# Patient Record
Sex: Female | Born: 1990 | Race: Black or African American | Hispanic: No | Marital: Single | Smoking: Never smoker
Health system: Southern US, Community
[De-identification: ages and names within clinical notes are randomized; demographics above are authoritative.]

## PROBLEM LIST (undated history)

## (undated) DIAGNOSIS — K219 Gastro-esophageal reflux disease without esophagitis: Secondary | ICD-10-CM

## (undated) DIAGNOSIS — R569 Unspecified convulsions: Principal | ICD-10-CM

## (undated) DIAGNOSIS — T4145XA Adverse effect of unspecified anesthetic, initial encounter: Secondary | ICD-10-CM

## (undated) DIAGNOSIS — R0600 Dyspnea, unspecified: Secondary | ICD-10-CM

## (undated) DIAGNOSIS — D649 Anemia, unspecified: Secondary | ICD-10-CM

## (undated) DIAGNOSIS — Z9289 Personal history of other medical treatment: Secondary | ICD-10-CM

## (undated) DIAGNOSIS — J45909 Unspecified asthma, uncomplicated: Secondary | ICD-10-CM

## (undated) DIAGNOSIS — T8859XA Other complications of anesthesia, initial encounter: Secondary | ICD-10-CM

## (undated) DIAGNOSIS — D219 Benign neoplasm of connective and other soft tissue, unspecified: Secondary | ICD-10-CM

## (undated) HISTORY — PX: MYOMECTOMY: SHX85

## (undated) HISTORY — DX: Anemia, unspecified: D64.9

## (undated) HISTORY — DX: Unspecified asthma, uncomplicated: J45.909

## (undated) HISTORY — DX: Unspecified convulsions: R56.9

---
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
## 2012-08-24 ENCOUNTER — Ambulatory Visit: Payer: Self-pay | Admitting: Physician Assistant

## 2012-08-24 VITALS — BP 118/74 | HR 86 | Temp 98.6°F | Resp 16 | Ht 60.0 in | Wt 135.0 lb

## 2012-08-24 DIAGNOSIS — R350 Frequency of micturition: Secondary | ICD-10-CM

## 2012-08-24 DIAGNOSIS — R569 Unspecified convulsions: Secondary | ICD-10-CM

## 2012-08-24 DIAGNOSIS — N760 Acute vaginitis: Secondary | ICD-10-CM

## 2012-08-24 LAB — POCT URINALYSIS DIPSTICK
Glucose, UA: NEGATIVE
Ketones, UA: NEGATIVE
Spec Grav, UA: 1.02
Urobilinogen, UA: 0.2

## 2012-08-24 LAB — POCT URINE PREGNANCY: Preg Test, Ur: NEGATIVE

## 2012-08-24 LAB — POCT WET PREP WITH KOH
WBC Wet Prep HPF POC: NEGATIVE
Yeast Wet Prep HPF POC: NEGATIVE

## 2012-08-24 LAB — POCT UA - MICROSCOPIC ONLY

## 2012-08-24 NOTE — Progress Notes (Signed)
Subjective:    Patient ID: Audrey Lang, female    DOB: 1991-12-06, 21 y.o.   MRN: [id number]  HPI This 21 y.o. female presents for evaluation of itching and vaginal discharge.  Thought she had a yeast infection, and a cold (had chills and back pain).  Used OTC Monistat yesterday and felt less itchy.   Still has itching and has some back pain, chills resolved.  Increased urinary frequency with low volume, has been drinking lots of water. No GI symptoms.  Monogamous sex with one partner, who lives in [place].  She's just moved here to attend school (taking classes at [employer] to transfer to [employer]).  Only takes topiramate intermittently, due to adverse effects, but has remained seizure free.    Review of Systems As above.   Past Medical History  Diagnosis Date  . Seizures 2009    last seizure 2011    Past Surgical History  Procedure Date  . Tonsillectomy 2003    Prior to Admission medications   Medication Sig Start Date End Date Taking? Authorizing Provider  topiramate (TOPAMAX) 50 MG tablet Take 100 mg by mouth 2 (two) times daily.   Yes Historical Provider, MD    No Known Allergies  History   Social History  . Marital Status: Single    Spouse Name: n/a    Number of Children: 0  . Years of Education: N/A   Occupational History  . Student     [employer]   Social History Main Topics  . Smoking status: Never Smoker   . Smokeless tobacco: Never Used  . Alcohol Use: 0.5 oz/week    1 drink(s) per week  . Drug Use: No  . Sexually Active: Not Currently -- Female partner(s)    Birth Control/ Protection: Condom   Other Topics Concern  . Not on file   Social History Narrative   Boyfriend lives in [place].    Family History  Problem Relation Age of Onset  . Ulcers Mother   . Asthma Sister        Objective:   Physical Exam  Blood pressure 118/74, pulse 86, temperature 98.6 F (37 C), temperature source Oral, resp. rate 16,  height 5' (1.524 m), weight 135 lb (61.236 kg), last menstrual period 08/24/2012, SpO2 98.00%. Body mass index is 26.37 kg/(m^2). Well-developed, well nourished BF who is awake, alert and oriented, in NAD. HEENT: [place]/AT, sclera and conjunctiva are clear.   Neck: supple, non-tender, no lymphadenopathy, thyromegaly. Heart: RRR, no murmur Lungs: normal effort, CTA Abdomen: normo-active bowel sounds, supple, no mass or organomegaly.  Mild suprapubic tenderness.  Tenderness with palpation of the flank and low back, questionable CVA tenderness. Extremities: no cyanosis, clubbing or edema. Skin: warm and dry without rash. Genitalia: normal female external genitalia.  Small amount of blood in the vaginal vault, consistent with onset of menstruation today.  No lesions.  Cervix without lesions, os is nulliparous and closed.  Diffuse tenderness on bimanual exam.  No masses.  Results for orders placed in visit on 08/24/12  POCT URINALYSIS DIPSTICK      Component Value Range   Color, UA yellow     Clarity, UA clear     Glucose, UA neg     Bilirubin, UA neg     Ketones, UA neg     Spec Grav, UA 1.020     Blood, UA moderate     pH, UA 7.0     Protein, UA neg  Urobilinogen, UA 0.2     Nitrite, UA neg     Leukocytes, UA Negative    POCT UA - MICROSCOPIC ONLY      Component Value Range   WBC, Ur, HPF, POC 0-2     RBC, urine, microscopic 0-2     Bacteria, U Microscopic neg     Mucus, UA neg     Epithelial cells, urine per micros 0-2     Crystals, Ur, HPF, POC neg     Casts, Ur, LPF, POC neg     Yeast, UA neg    POCT URINE PREGNANCY      Component Value Range   Preg Test, Ur Negative    POCT WET PREP WITH KOH      Component Value Range   Trichomonas, UA Negative     Clue Cells Wet Prep HPF POC 3-8     Epithelial Wet Prep HPF POC 6-11     Yeast Wet Prep HPF POC neg     Bacteria Wet Prep HPF POC 2+     RBC Wet Prep HPF POC tntc     WBC Wet Prep HPF POC neg     KOH Prep POC Negative           Assessment & Plan:   1. Seizures    2. Vaginitis  POCT Wet Prep with KOH  3. Urinary frequency  POCT urinalysis dipstick, POCT UA - Microscopic Only, POCT urine pregnancy   Her symptom etiology is unclear at this point.  I suspect that she has treated the vaginal yeast infection, but that her itching is not resolved because of the onset of menstrual bleeding.  She's also recovering from a URI, which is the likely cause of her myalgias.    Patient Instructions  It is ok to use tampons during this period. It does not appear that you have any type of infection in the vagina, but given the bleeding from your period, it is possible that you do. Use OTC ibuprofen and/or acetaminophen as needed for pain. Drink at least 64 ounces of water each day. Get plenty of rest. If your symptoms worsen, or if they persist once you complete your period, return for re-evaluation.

## 2012-08-24 NOTE — Patient Instructions (Signed)
It is ok to use tampons during this period. It does not appear that you have any type of infection in the vagina, but given the bleeding from your period, it is possible that you do. Use OTC ibuprofen and/or acetaminophen as needed for pain. Drink at least 64 ounces of water each day. Get plenty of rest. If your symptoms worsen, or if they persist once you complete your period, return for re-evaluation.

## 2012-12-27 ENCOUNTER — Ambulatory Visit: Payer: Self-pay | Admitting: Family Medicine

## 2012-12-27 ENCOUNTER — Encounter: Payer: Self-pay | Admitting: Family Medicine

## 2012-12-27 VITALS — BP 110/73 | HR 84 | Temp 98.0°F | Resp 17 | Ht 60.0 in | Wt 129.0 lb

## 2012-12-27 DIAGNOSIS — Z0289 Encounter for other administrative examinations: Secondary | ICD-10-CM
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
## 2013-04-27 ENCOUNTER — Telehealth: Payer: Self-pay

## 2013-04-27 NOTE — Telephone Encounter (Signed)
Audrey Lang,   Patient wants to be sure you call her when her paperwork is completed.  She dropped it off yesterday    [phone]

## 2013-04-27 NOTE — Telephone Encounter (Signed)
Form completed and pt notified ready for p/up.

## 2014-07-10 ENCOUNTER — Ambulatory Visit (INDEPENDENT_AMBULATORY_CARE_PROVIDER_SITE_OTHER): Payer: No Typology Code available for payment source | Admitting: Family Medicine

## 2014-07-10 VITALS — BP 124/76 | HR 94 | Temp 98.0°F | Resp 17 | Ht 60.5 in | Wt 133.0 lb

## 2014-07-10 DIAGNOSIS — Z111 Encounter for screening for respiratory tuberculosis: Secondary | ICD-10-CM

## 2014-07-10 DIAGNOSIS — Z719 Counseling, unspecified: Secondary | ICD-10-CM

## 2014-07-10 DIAGNOSIS — Z0000 Encounter for general adult medical examination without abnormal findings: Secondary | ICD-10-CM

## 2014-07-10 DIAGNOSIS — G40909 Epilepsy, unspecified, not intractable, without status epilepticus: Secondary | ICD-10-CM

## 2014-07-10 NOTE — Progress Notes (Signed)

## 2014-07-10 NOTE — Patient Instructions (Addendum)
Return for reading of your TB skin test in 48-72 hours.   Check into last tetanus - if more than 10 years, need new one.  I recommend HPV vaccine, meningitis vaccine as well if you have not had these. See information below and return for these when you are ready.  Pap testing recommended if you have not had this in past 3 years,  But I will refer you to OBGYN.   Schedule appt with dentist.  Keep follow up and plan with neurologist.  Return to the clinic or go to the nearest emergency room if any of your symptoms worsen or new symptoms occur.   Keeping You Healthy  Get These Tests 1. Blood Pressure- Have your blood pressure checked once a year by your health care provider.  Normal blood pressure is 120/80. 2. Weight- Have your body mass index (BMI) calculated to screen for obesity.  BMI is measure of body fat based on height and weight.  You can also calculate your own BMI at [email or web address]. 3. Cholesterol- Have your cholesterol checked every 5 years starting at age 51 then yearly starting at age 59. 45. Chlamydia, HIV, and other sexually transmitted diseases- Get screened every year until age 51, then within three months of each new sexual provider. 5. Pap Smear- Every 1-3 years; discuss with your health care provider. 6. Mammogram- Every year starting at age 64  Take these medicines  Calcium with Vitamin D-Your body needs 1200 mg of Calcium each day and [id number] IU of Vitamin D daily.  Your body can only absorb 500 mg of Calcium at a time so Calcium must be taken in 2 or 3 divided doses throughout the day.  Multivitamin with folic acid- Once daily if it is possible for you to become pregnant.  Get these Immunizations  Gardasil-Series of three doses; prevents HPV related illness such as genital warts and cervical cancer.  Menactra-Single dose; prevents meningitis.  Tetanus shot- Every 10 years.  Flu shot-Every year.  Take these steps 1. Do not smoke-Your healthcare  provider can help you quit.  For tips on how to quit go to www.smokefree.gov or call 1-800 QUITNOW. 2. Be physically active- Exercise 5 days a week for at least 30 minutes.  If you are not already physically active, start slow and gradually work up to 30 minutes of moderate physical activity.  Examples of moderate activity include walking briskly, dancing, swimming, bicycling, etc. 3. Breast Cancer- A self breast exam every month is important for early detection of breast cancer.  For more information and instruction on self breast exams, ask your healthcare provider or https://www.patel.info/. 4. Eat a healthy diet- Eat a variety of healthy foods such as fruits, vegetables, whole grains, low fat milk, low fat cheeses, yogurt, lean meats, poultry and fish, beans, nuts, tofu, etc.  For more information go to www. Thenutritionsource.org 5. Drink alcohol in moderation- Limit alcohol intake to one drink or less per day. Never drink and drive. 6. Depression- Your emotional health is as important as your physical health.  If you're feeling down or losing interest in things you normally enjoy please talk to your healthcare provider about being screened for depression. 7. Dental visit- Brush and floss your teeth twice daily; visit your dentist twice a year. 8. Eye doctor- Get an eye exam at least every 2 years. 9. Helmet use- Always wear a helmet when riding a bicycle, motorcycle, rollerblading or skateboarding. 28. Safe sex- If you may be exposed to sexually transmitted  infections, use a condom. 11. Seat belts- Seat belts can save your live; always wear one. 12. Smoke/Carbon Monoxide detectors- These detectors need to be installed on the appropriate level of your home. Replace batteries at least once a year. 13. Skin cancer- When out in the sun please cover up and use sunscreen 15 SPF or higher. 14. Violence- If anyone is threatening or hurting you, please tell your healthcare  provider.         HPV Vaccine Gardasil (Human Papillomavirus): What You Need to Know 1. What is HPV? Genital human papillomavirus (HPV) is the most common sexually transmitted virus in the [place]. More than half of sexually active men and women are infected with HPV at some time in their lives. About 20 million Americans are currently infected, and about 6 million more get infected each year. HPV is usually spread through sexual contact. Most HPV infections don't cause any symptoms, and go away on their own. But HPV can cause cervical cancer in women. Cervical cancer is the 2nd leading cause of cancer deaths among women around the world. In the [place], about 12,000 women get cervical cancer every year and about 4,000 are expected to die from it. HPV is also associated with several less common cancers, such as vaginal and vulvar cancers in women, and anal and oropharyngeal (back of the throat, including base of tongue and tonsils) cancers in both men and women. HPV can also cause genital warts and warts in the throat. There is no cure for HPV infection, but some of the problems it causes can be treated. 2. HPV vaccine: Why get vaccinated? The HPV vaccine you are getting is one of two vaccines that can be given to prevent HPV. It may be given to both males and females.  This vaccine can prevent most cases of cervical cancer in females, if it is given before exposure to the virus. In addition, it can prevent vaginal and vulvar cancer in females, and genital warts and anal cancer in both males and females. Protection from HPV vaccine is expected to be long-lasting. But vaccination is not a substitute for cervical cancer screening. Women should still get regular Pap tests. 3. Who should get this HPV vaccine and when? HPV vaccine is given as a 3-dose series  1st Dose: Now  2nd Dose: 1 to 2 months after Dose 1  3rd Dose: 6 months after Dose 1 Additional (booster) doses are not  recommended. Routine vaccination  This HPV vaccine is recommended for girls and boys 60 or 24 years of age. It may be given starting at age 43. Why is HPV vaccine recommended at 41 or 24 years of age?  HPV infection is easily acquired, even with only one sex partner. That is why it is important to get HPV vaccine before any sexual contact takes place. Also, response to the vaccine is better at this age than at older ages. Catch-up vaccination This vaccine is recommended for the following people who have not completed the 3-dose series:   Females 79 through 24 years of age.  Males 31 through 24 years of age. This vaccine may be given to men 50 through 24 years of age who have not completed the 3-dose series. It is recommended for men through age 28 who have sex with men or whose immune system is weakened because of HIV infection, other illness, or medications.  HPV vaccine may be given at the same time as other vaccines. 4. Some people should  not get HPV vaccine or should wait.  Anyone who has ever had a life-threatening allergic reaction to any component of HPV vaccine, or to a previous dose of HPV vaccine, should not get the vaccine. Tell your doctor if the person getting vaccinated has any severe allergies, including an allergy to yeast.  HPV vaccine is not recommended for pregnant women. However, receiving HPV vaccine when pregnant is not a reason to consider terminating the pregnancy. Women who are breast feeding may get the vaccine.  People who are mildly ill when a dose of HPV is planned can still be vaccinated. People with a moderate or severe illness should wait until they are better. 5. What are the risks from this vaccine? This HPV vaccine has been used in the [place] and around the world for about six years and has been very safe. However, any medicine could possibly cause a serious problem, such as a severe allergic reaction. The risk of any vaccine causing a serious injury, or death,  is extremely small. Life-threatening allergic reactions from vaccines are very rare. If they do occur, it would be within a few minutes to a few hours after the vaccination. Several mild to moderate problems are known to occur with this HPV vaccine. These do not last long and go away on their own.  Reactions in the arm where the shot was given:  Pain (about 8 people in 10)  Redness or swelling (about 1 person in 4)  Fever:  Mild (100 F) (about 1 person in 10)  Moderate (102 F) (about 1 person in 36)  Other problems:  Headache (about 1 person in 3)  Fainting: Brief fainting spells and related symptoms (such as jerking movements) can happen after any medical procedure, including vaccination. Sitting or lying down for about 15 minutes after a vaccination can help prevent fainting and injuries caused by falls. Tell your doctor if the patient feels dizzy or light-headed, or has vision changes or ringing in the ears.  Like all vaccines, HPV vaccines will continue to be monitored for unusual or severe problems. 6. What if there is a serious reaction? What should I look for?  Look for anything that concerns you, such as signs of a severe allergic reaction, very high fever, or behavior changes. Signs of a severe allergic reaction can include hives, swelling of the face and throat, difficulty breathing, a fast heartbeat, dizziness, and weakness. These would start a few minutes to a few hours after the vaccination.  What should I do?  If you think it is a severe allergic reaction or other emergency that can't wait, call 9-1-1 or get the person to the nearest hospital. Otherwise, call your doctor.  Afterward, the reaction should be reported to the Vaccine Adverse Event Reporting System (VAERS). Your doctor might file this report, or you can do it yourself through the VAERS web site at www.vaers.SamedayNews.es, or by calling (916)388-1915. VAERS is only for reporting reactions. They do not give  medical advice. 7. The National Vaccine Injury Compensation Program  The [employer] Vaccine Injury Compensation Program (VICP) is a federal program that was created to compensate people who may have been injured by certain vaccines.  Persons who believe they may have been injured by a vaccine can learn about the program and about filing a claim by calling (570)360-8005 or visiting the [place] website at GoldCloset.com.ee. 8. How can I learn more?  Ask your doctor.  Call your local or state health department.  Contact the Centers  for Disease Control and Prevention (CDC):  Call (845)019-2768 (1-800-CDC-INFO)  or  Visit CDC's website at http://hunter.com/ CDC Human Papillomavirus (HPV) Gardasil (Interim) 04/22/12 Document Released: 09/20/2006 Document Revised: 04/09/2014 Document Reviewed: 01/04/2014 Exit[employer] Patient Information 2015 [place], [place]. This information is not intended to replace advice given to you by your health care provider. Make sure you discuss any questions you have with your health care provider. Meningococcal Vaccines: What You Need to Know 1. What is meningococcal disease? Meningococcal disease is a serious bacterial illness. It is a leading cause of bacterial meningitis in children 2 through 71 years old in the [place]. Meningitis is an infection of the covering of the brain and the spinal cord. Meningococcal disease also causes blood infections. About 1,000-1,200 people get meningococcal disease each year in the [place] Even when they are treated with antibiotics, 10-15% of these people die. Of those who live, another 11%-19% lose their arms or legs, have problems with their nervous systems, become deaf, or suffer seizures or strokes. Anyone can get meningococcal disease. But it is most common in infants less than one year of age and people 16-21 years. Children with certain medical conditions, such as lack of a spleen, have an increased risk of  getting meningococcal disease. College freshmen living in [place] are also at increased risk. Meningococcal infections can be treated with drugs such as penicillin. Still, many people who get the disease die from it, and many others are affected for life. This is why preventing the disease through use of meningococcal vaccine is important for people at highest risk. 2. Meningococcal vaccine There are two kinds of meningococcal vaccine in the [place]  Meningococcal conjugate vaccine (MCV4) is the preferred vaccine for people 75 years of age and younger.  Meningococcal polysaccharide vaccine (MPSV4) has been available since the 1970s. It is the only meningococcal vaccine licensed for people older than 66. Both vaccines can prevent 4 types of meningococcal disease, including 2 of the 3 types most common in the [place] and a type that causes epidemics in [place]. There are other types of meningococcal disease; the vaccines do not protect against these.  3. Who should get meningococcal vaccine and when? Routine vaccination Two doses of MCV4 are recommended for adolescents 11 through 24 years of age: the first dose at 9 or 24 years of age, with a booster dose at age 47. Adolescents in this age group with HIV infection should get 3 doses: 2 doses 2 months apart at 73 or 12 years, plus a booster at age 22. If the first dose (or series) is given between 40 and 46 years of age, the booster should be given between 64 and 17. If the first dose (or series) is given after the 16th birthday, a booster is not needed. Other people at increased risk  College freshmen living in dormitories.  Laboratory personnel who are routinely exposed to meningococcal bacteria.  [employer] recruits.  Anyone traveling to, or living in, a part of the world where meningococcal disease is common, such as parts of [place].  Anyone who has a damaged spleen, or whose spleen has been removed.  Anyone who has persistent  complement component deficiency (an immune system disorder).  People who might have been exposed to meningitis during an outbreak. Children between 52 and 101 months of age, and anyone else with certain medical conditions need 2 doses for adequate protection. Ask your doctor about the number and timing of doses, and the need for booster doses.  MCV4 is the preferred vaccine for people in these groups who are 9 months through 24 years of age. MPSV4 can be used for adults older than 55. 4. Some people should not get meningococcal vaccine or should wait.  Anyone who has ever had a severe (life-threatening) allergic reaction to a previous dose of MCV4 or MPSV4 vaccine should not get another dose of either vaccine.  Anyone who has a severe (life threatening) allergy to any vaccine component should not get the vaccine. Tell your doctor if you have any severe allergies.  Anyone who is moderately or severely ill at the time the shot is scheduled should probably wait until they recover. Ask your doctor. People with a mild illness can usually get the vaccine.  Meningococcal vaccines may be given to pregnant women. MCV4 is a fairly new vaccine and has not been studied in pregnant women as much as MPSV4 has. It should be used only if clearly needed. The manufacturers of MCV4 maintain pregnancy registries for women who are vaccinated while pregnant. Except for children with sickle cell disease or without a working spleen, meningococcal vaccines may be given at the same time as other vaccines. 5. What are the risks from meningococcal vaccines? A vaccine, like any medicine, could possibly cause serious problems, such as severe allergic reactions. The risk of meningococcal vaccine causing serious harm, or death, is extremely small. Brief fainting spells and related symptoms (such as jerking or seizure-like movements) can follow a vaccination. They happen most often with adolescents, and they can result in falls and  injuries. Sitting or lying down for about 15 minutes after getting the shot--especially if you feel faint--can help prevent these injuries. Mild problems As many as half the people who get meningococcal vaccines have mild side effects, such as redness or pain where the shot was given. If these problems occur, they usually last for 1 or 2 days. They are more common after MCV4 than after MPSV4. A small percentage of people who receive the vaccine develop a mild fever. Severe problems Serious allergic reactions, within a few minutes to a few hours of the shot, are very rare. 6. What if there is a serious reaction? What should I look for? Look for anything that concerns you, such as signs of a severe allergic reaction, very high fever, or behavior changes. Signs of a severe allergic reaction can include hives, swelling of the face and throat, difficulty breathing, a fast heartbeat, dizziness, and weakness. These would start a few minutes to a few hours after the vaccination. What should I do?  If you think it is a severe allergic reaction or other emergency that can't wait, call 9-1-1 or get the person to the nearest hospital. Otherwise, call your doctor.  Afterward, the reaction should be reported to the Vaccine Adverse Event Reporting System (VAERS). Your doctor might file this report, or you can do it yourself through the VAERS web site at www.vaers.SamedayNews.es, or by calling (732)263-5575. VAERS is only for reporting reactions. They do not give medical advice. 7. The National Vaccine Injury Compensation Program The [employer] Vaccine Injury Compensation Program (VICP) is a federal program that was created to compensate people who may have been injured by certain vaccines. Persons who believe they may have been injured by a vaccine can learn about the program and about filing a claim by calling 858-679-3017 or visiting the [place] website at GoldCloset.com.ee. 8. How can I learn  more?  Ask your doctor.  Call your local or  state health department.  Contact the Centers for Disease Control and Prevention (CDC):  Call (564)817-0620 (1-800-CDC-INFO) or  Visit the CDC's website at http://hunter.com/ CDC Meningococcal Vaccine (Interim) VIS (09/19/2010) Document Released: 09/20/2006 Document Revised: 04/09/2014 Document Reviewed: 03/15/2013 [facility] Patient Information 2015 [place]. This information is not intended to replace advice given to you by your health care provider. Make sure you discuss any questions you have with your health care provider.
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
## 2014-07-10 NOTE — Progress Notes (Signed)
   Subjective:    Patient ID: Audrey Lang, female    DOB: 01-Aug-1991, 23 y.o.   MRN: [id number]  HPI    Review of Systems     Objective:   Physical Exam        Assessment & Plan:

## 2014-07-12 ENCOUNTER — Ambulatory Visit (INDEPENDENT_AMBULATORY_CARE_PROVIDER_SITE_OTHER): Payer: No Typology Code available for payment source

## 2014-07-12 DIAGNOSIS — Z111 Encounter for screening for respiratory tuberculosis: Secondary | ICD-10-CM

## 2014-07-12 DIAGNOSIS — Z09 Encounter for follow-up examination after completed treatment for conditions other than malignant neoplasm: Secondary | ICD-10-CM

## 2014-07-12 LAB — TB SKIN TEST: TB SKIN TEST: NEGATIVE

## 2014-08-01 ENCOUNTER — Ambulatory Visit (INDEPENDENT_AMBULATORY_CARE_PROVIDER_SITE_OTHER): Payer: No Typology Code available for payment source | Admitting: Physician Assistant

## 2014-08-01 VITALS — BP 114/72 | HR 94 | Resp 16 | Wt 132.4 lb

## 2014-08-01 DIAGNOSIS — Z23 Encounter for immunization: Secondary | ICD-10-CM

## 2014-08-01 NOTE — Progress Notes (Signed)
   Subjective:    Patient ID: Audrey Lang, female    DOB: 1991-03-15, 23 y.o.   MRN: [id number]   PCP: No PCP Per Patient  Chief Complaint  Patient presents with  . Immunizations    TDAP    HPI  Needs Tdap for school. 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  No vaccine records with her today, no vaccines in the [facility].   Review of Systems     Objective:   Physical Exam  Constitutional: She is oriented to person, place, and time. She appears well-developed and well-nourished. No distress.  BP 114/72  Pulse 94  Resp 16  Wt 132 lb 6.4 oz (60.056 kg)  SpO2 97%  LMP 07/24/2014   Pulmonary/Chest: Effort normal.  Neurological: She is alert and oriented to person, place, and time.  Psychiatric: She has a normal mood and affect. Her speech is normal and behavior is normal.          Assessment & Plan:  1. Need for Tdap vaccination Documented in [facility]. Encouraged influenza vaccine, which she declined.  Encouraged her to bring her vaccine records in to be added to our system and the [facility]. - Tdap vaccine greater than or equal to 7yo IM   Fara Chute, PA-C Physician Assistant-Certified [facility] [place]

## 2014-08-23 ENCOUNTER — Other Ambulatory Visit (HOSPITAL_COMMUNITY)
Admission: RE | Admit: 2014-08-23 | Discharge: 2014-08-23 | Disposition: A | Discharge status: Home / Self Care | Payer: Self-pay | Source: Ambulatory Visit | Attending: Obstetrics & Gynecology | Admitting: Obstetrics & Gynecology

## 2014-08-23 ENCOUNTER — Other Ambulatory Visit: Payer: Self-pay | Admitting: Obstetrics & Gynecology

## 2014-08-23 DIAGNOSIS — Z113 Encounter for screening for infections with a predominantly sexual mode of transmission: Secondary | ICD-10-CM | POA: Insufficient documentation

## 2014-08-23 DIAGNOSIS — Z01419 Encounter for gynecological examination (general) (routine) without abnormal findings: Secondary | ICD-10-CM | POA: Insufficient documentation

## 2014-08-27 LAB — CYTOLOGY - PAP

## 2015-05-08 ENCOUNTER — Inpatient Hospital Stay (HOSPITAL_COMMUNITY)
Admission: AD | Admit: 2015-05-08 | Discharge: 2015-05-08 | Disposition: A | Discharge status: Home / Self Care | Payer: BLUE CROSS/BLUE SHIELD | Source: Ambulatory Visit | Attending: Family Medicine | Admitting: Family Medicine

## 2015-05-08 DIAGNOSIS — N390 Urinary tract infection, site not specified: Secondary | ICD-10-CM | POA: Insufficient documentation

## 2015-05-08 DIAGNOSIS — R102 Pelvic and perineal pain: Secondary | ICD-10-CM | POA: Diagnosis not present

## 2015-05-08 LAB — CBC WITH DIFFERENTIAL/PLATELET
BASOS ABS: 0 10*3/uL (ref 0.0–0.1)
Basophils Relative: 0 % (ref 0–1)
Eosinophils Absolute: 0.1 10*3/uL (ref 0.0–0.7)
Eosinophils Relative: 1 % (ref 0–5)
HCT: 34.8 % — ABNORMAL LOW (ref 36.0–46.0)
HEMOGLOBIN: 11.9 g/dL — AB (ref 12.0–15.0)
LYMPHS PCT: 48 % — AB (ref 12–46)
Lymphs Abs: 2.1 10*3/uL (ref 0.7–4.0)
MCH: 29.3 pg (ref 26.0–34.0)
MCHC: 34.2 g/dL (ref 30.0–36.0)
MCV: 85.7 fL (ref 78.0–100.0)
MONO ABS: 0.6 10*3/uL (ref 0.1–1.0)
Monocytes Relative: 13 % — ABNORMAL HIGH (ref 3–12)
NEUTROS PCT: 38 % — AB (ref 43–77)
Neutro Abs: 1.7 10*3/uL (ref 1.7–7.7)
Platelets: 276 10*3/uL (ref 150–400)
RBC: 4.06 MIL/uL (ref 3.87–5.11)
RDW: 13.9 % (ref 11.5–15.5)
WBC: 4.5 10*3/uL (ref 4.0–10.5)

## 2015-05-08 LAB — URINALYSIS, ROUTINE W REFLEX MICROSCOPIC
Bilirubin Urine: NEGATIVE
Glucose, UA: 250 mg/dL — AB
HGB URINE DIPSTICK: NEGATIVE
KETONES UR: 15 mg/dL — AB
Leukocytes, UA: NEGATIVE
Nitrite: POSITIVE — AB
PH: 5 (ref 5.0–8.0)
Protein, ur: 100 mg/dL — AB
Specific Gravity, Urine: 1.01 (ref 1.005–1.030)

## 2015-05-08 LAB — WET PREP, GENITAL
CLUE CELLS WET PREP: NONE SEEN
Trich, Wet Prep: NONE SEEN
Yeast Wet Prep HPF POC: NONE SEEN

## 2015-05-08 LAB — URINE MICROSCOPIC-ADD ON

## 2015-05-08 LAB — POCT PREGNANCY, URINE: Preg Test, Ur: NEGATIVE

## 2015-05-08 MED ORDER — SULFAMETHOXAZOLE-TRIMETHOPRIM 800-160 MG PO TABS
1.0000 | ORAL_TABLET | Freq: Two times a day (BID) | ORAL | Status: AC
Start: 2015-05-08 — End: 2015-05-15

## 2015-05-08 NOTE — MAU Note (Signed)
Pt presents complaining of pain in her pelvis. States it is "on fire." Pt also has pain with urination. Was treated in May for yeast and BV. Denies vaginal bleeding.

## 2015-05-08 NOTE — Progress Notes (Signed)
Took Azo this morning, urine cloudy and burning.

## 2015-05-08 NOTE — MAU Provider Note (Signed)
History     CSN: [id number]  Arrival date and time: 05/08/15 1044   None     Chief Complaint  Patient presents with  . Pelvic Pain   HPI   Ms. Audrey Lang is a 24 y.o. female [place] who presents with pelvic pain. The pain started in April; she seen for this and was told that she had a yeast infection. [facility]. She went back to urgent care and she was told she had BV. She continues to have pelvic pain. The last time she was seen was 3 weeks ago. The pain is located all across her lower abdomen. The pain is described as sharp, the pain comes and goes. She has taken AZO today for the pain; she has not tried NSAIDS.  She is very conscious about using condoms however she did have a condom break during intercourse.   OB History    Gravida Para Term Preterm AB TAB SAB Ectopic Multiple Living   0 0 0 0 0 0 0 0 0 0       Past Medical History  Diagnosis Date  . Seizures 2009    last seizure 2011  . Anemia   . Asthma     Past Surgical History  Procedure Laterality Date  . Tonsillectomy  2003    Family History  Problem Relation Age of Onset  . Ulcers Mother   . Asthma Sister     History  Substance Use Topics  . Smoking status: Never Smoker   . Smokeless tobacco: Never Used  . Alcohol Use: 0.5 oz/week    1 drink(s) per week    Allergies:  Allergies  Allergen Reactions  . Lamictal [Lamotrigine]     rash    Prescriptions prior to admission  Medication Sig Dispense Refill Last Dose  . topiramate (TOPAMAX) 50 MG tablet Take 100 mg by mouth 2 (two) times daily.   Taking   Results for orders placed or performed during the hospital encounter of 05/08/15 (from the past 48 hour(s))  Urinalysis, Routine w reflex microscopic (not at [facility])     Status: Abnormal   Collection Time: 05/08/15 10:33 AM  Result Value Ref Range   Color, Urine AMBER (A) YELLOW    Comment: BIOCHEMICALS MAY BE AFFECTED BY COLOR   APPearance HAZY (A) CLEAR   Specific Gravity, Urine 1.010 1.005 - 1.030   pH 5.0 5.0 - 8.0   Glucose, UA 250 (A) NEGATIVE mg/dL   Hgb urine dipstick NEGATIVE NEGATIVE   Bilirubin Urine NEGATIVE NEGATIVE   Ketones, ur 15 (A) NEGATIVE mg/dL   Protein, ur 100 (A) NEGATIVE mg/dL   Urobilinogen, UA >8.0 (H) 0.0 - 1.0 mg/dL   Nitrite POSITIVE (A) NEGATIVE   Leukocytes, UA NEGATIVE NEGATIVE  Urine microscopic-add on     Status: Abnormal   Collection Time: 05/08/15 10:33 AM  Result Value Ref Range   Squamous Epithelial / LPF FEW (A) RARE   WBC, UA 0-2 <3 WBC/hpf   Bacteria, UA FEW (A) RARE  Pregnancy, urine POC     Status: None   Collection Time: 05/08/15 11:04 AM  Result Value Ref Range   Preg Test, Ur NEGATIVE NEGATIVE    Comment:        THE SENSITIVITY OF THIS METHODOLOGY IS >24 mIU/mL   Wet prep, genital     Status: Abnormal   Collection Time: 05/08/15 12:00 PM  Result Value Ref  Range   Yeast Wet Prep HPF POC NONE SEEN NONE SEEN   Trich, Wet Prep NONE SEEN NONE SEEN   Clue Cells Wet Prep HPF POC NONE SEEN NONE SEEN   WBC, Wet Prep HPF POC FEW (A) NONE SEEN    Comment: MANY BACTERIA SEEN  CBC with Differential     Status: Abnormal   Collection Time: 05/08/15 12:24 PM  Result Value Ref Range   WBC 4.5 4.0 - 10.5 K/uL   RBC 4.06 3.87 - 5.11 MIL/uL   Hemoglobin 11.9 (L) 12.0 - 15.0 g/dL   HCT 34.8 (L) 36.0 - 46.0 %   MCV 85.7 78.0 - 100.0 fL   MCH 29.3 26.0 - 34.0 pg   MCHC 34.2 30.0 - 36.0 g/dL   RDW 13.9 11.5 - 15.5 %   Platelets 276 150 - 400 K/uL   Neutrophils Relative % 38 (L) 43 - 77 %   Neutro Abs 1.7 1.7 - 7.7 K/uL   Lymphocytes Relative 48 (H) 12 - 46 %   Lymphs Abs 2.1 0.7 - 4.0 K/uL   Monocytes Relative 13 (H) 3 - 12 %   Monocytes Absolute 0.6 0.1 - 1.0 K/uL   Eosinophils Relative 1 0 - 5 %   Eosinophils Absolute 0.1 0.0 - 0.7 K/uL   Basophils Relative 0 0 - 1 %   Basophils Absolute 0.0 0.0 - 0.1 K/uL    Review of Systems  Constitutional: Negative for  fever and chills.  Gastrointestinal: Positive for abdominal pain.  Genitourinary: Positive for dysuria and flank pain. Negative for urgency, frequency and hematuria.   Physical Exam   Blood pressure 145/82, pulse 99, temperature 98 F (36.7 C), temperature source Oral, resp. rate 18, height 5\' 1"  (1.549 m), weight 60.147 kg (132 lb 9.6 oz), last menstrual period 04/18/2015.  Physical Exam  Constitutional: She is oriented to person, place, and time. She appears well-developed and well-nourished. No distress.  HENT:  Head: Normocephalic.  Eyes: Pupils are equal, round, and reactive to light.  Neck: Neck supple.  GI: Soft. There is tenderness in the suprapubic area. There is CVA tenderness (Left sided). There is no rigidity and no guarding.  Genitourinary:  Speculum exam: Vagina - Small amount of creamy, white discharge, no odor Cervix - No contact bleeding Bimanual exam: Cervix closed, mild CMT  Uterus non tender, normal size Adnexa non tender, no masses bilaterally, + suprapubic tenderness  GC/Chlam, wet prep done Chaperone present for exam.  Musculoskeletal: Normal range of motion.  Neurological: She is alert and oriented to person, place, and time.  Skin: Skin is warm. She is not diaphoretic.  Psychiatric: Her behavior is normal.    MAU Course  Procedures  None  MDM  Urine culture pending Pain likely UTI related, unlikely PID> few WBC> normal appearance of vaginal discharge.    Assessment and Plan   Assessment:  1. UTI (lower urinary tract infection)   2. Pelvic pain in female      Plan:  Discharge home in stable condition Outpatient pelvic US ordered Patient says she will call GYN offices to schedule a follow up appointment and to establish care.  Condoms always RX: bactrim Urine culture pending     Kaithlyn Teagle IRENE 05/08/2015, 11:55 AM

## 2015-05-09 LAB — URINE CULTURE
COLONY COUNT: NO GROWTH
CULTURE: NO GROWTH

## 2015-05-09 LAB — GC/CHLAMYDIA PROBE AMP (~~LOC~~) NOT AT ARMC
Chlamydia: NEGATIVE
Neisseria Gonorrhea: NEGATIVE

## 2015-05-09 LAB — HIV ANTIBODY (ROUTINE TESTING W REFLEX): HIV Screen 4th Generation wRfx: NONREACTIVE

## 2015-05-10 ENCOUNTER — Ambulatory Visit (HOSPITAL_COMMUNITY)
Admission: RE | Admit: 2015-05-10 | Discharge: 2015-05-10 | Disposition: A | Discharge status: Home / Self Care | Payer: BLUE CROSS/BLUE SHIELD | Source: Ambulatory Visit | Attending: Obstetrics and Gynecology | Admitting: Obstetrics and Gynecology

## 2015-05-10 ENCOUNTER — Telehealth: Payer: Self-pay

## 2015-05-10 ENCOUNTER — Other Ambulatory Visit (HOSPITAL_COMMUNITY): Payer: Self-pay | Admitting: Obstetrics and Gynecology

## 2015-05-10 DIAGNOSIS — N859 Noninflammatory disorder of uterus, unspecified: Secondary | ICD-10-CM | POA: Insufficient documentation

## 2015-05-10 DIAGNOSIS — R102 Pelvic and perineal pain: Secondary | ICD-10-CM | POA: Diagnosis present

## 2015-05-10 NOTE — Telephone Encounter (Signed)
Dr. Roselie Awkward reviewed pts [place] results and stated that she will need to have further testing to determine what the mass is showing on her [place] results because it could just be a fibroid.  Called pt and informed pt of Dr. Jordan Hawks recommendation and that she would need to be seen by a provider for evaluation.  I asked if pt had a GYN that she was seeing.  Pt informed me "no".  I informed pt that I will send a message to our front office staff so that they will call her with an appt but may not be until the end of July.  Pt stated understanding with no further questions.

## 2015-05-13 ENCOUNTER — Telehealth: Payer: Self-pay | Admitting: Obstetrics & Gynecology

## 2015-05-13 NOTE — Telephone Encounter (Signed)
Patient called clinic to inform that she returned to [place] for the summer and will follow up at her local ob-gyn.

## 2016-01-02 ENCOUNTER — Other Ambulatory Visit: Payer: Self-pay | Admitting: Family Medicine

## 2016-01-02 DIAGNOSIS — N921 Excessive and frequent menstruation with irregular cycle: Secondary | ICD-10-CM

## 2016-01-07 ENCOUNTER — Ambulatory Visit
Admission: RE | Admit: 2016-01-07 | Discharge: 2016-01-07 | Disposition: A | Discharge status: Home / Self Care | Payer: BLUE CROSS/BLUE SHIELD | Source: Ambulatory Visit | Attending: Family Medicine | Admitting: Family Medicine

## 2016-01-07 DIAGNOSIS — N921 Excessive and frequent menstruation with irregular cycle: Secondary | ICD-10-CM

## 2016-11-06 IMAGING — US US TRANSVAGINAL NON-OB
1 series · 13 of 25 positions shown · non-contrast
Comparison: 05/10/2015 pelvic sonogram.

CLINICAL DATA: 24-year-old female with menorrhagia and a reported
menstrual cycle.

EXAM:
TRANSABDOMINAL AND TRANSVAGINAL ULTRASOUND OF PELVIS
TECHNIQUE: Both transabdominal and transvaginal ultrasound examinations of the
pelvis were performed. Transabdominal technique was performed for
global imaging of the pelvis including uterus, ovaries, adnexal
regions, and pelvic cul-de-sac. It was necessary to proceed with
endovaginal exam following the transabdominal exam to visualize the
endometrium, myometrium and adnexa.

[Series 1: us transvaginal non-ob · 0.28mm/px · 13 of 67 slices shown]
[im 1/67]
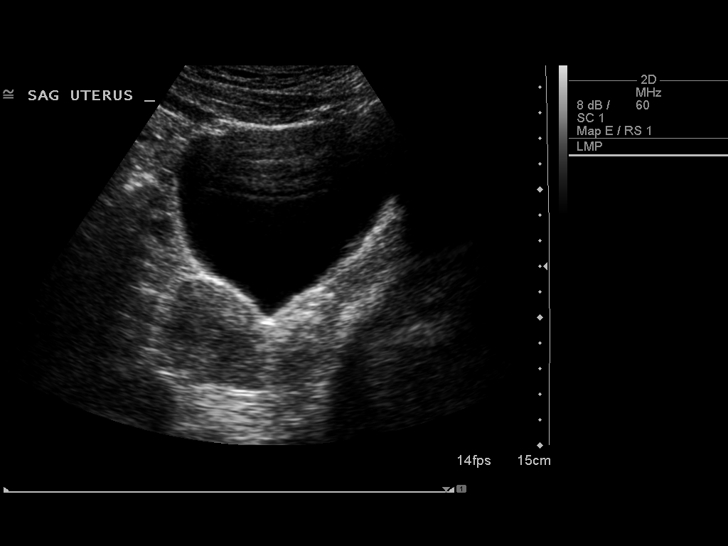
[im 6/67]
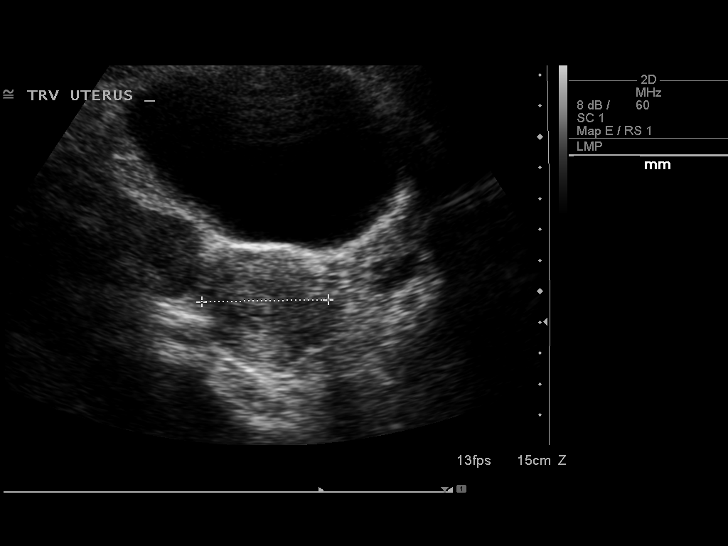
[im 12/67]
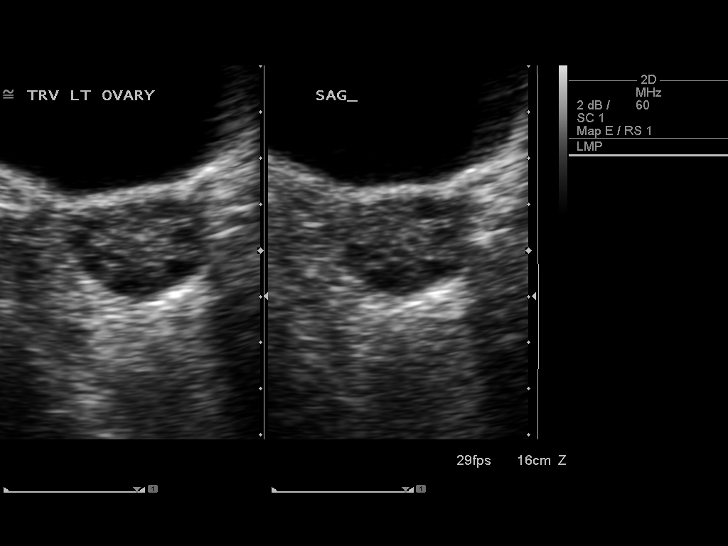
[im 17/67]
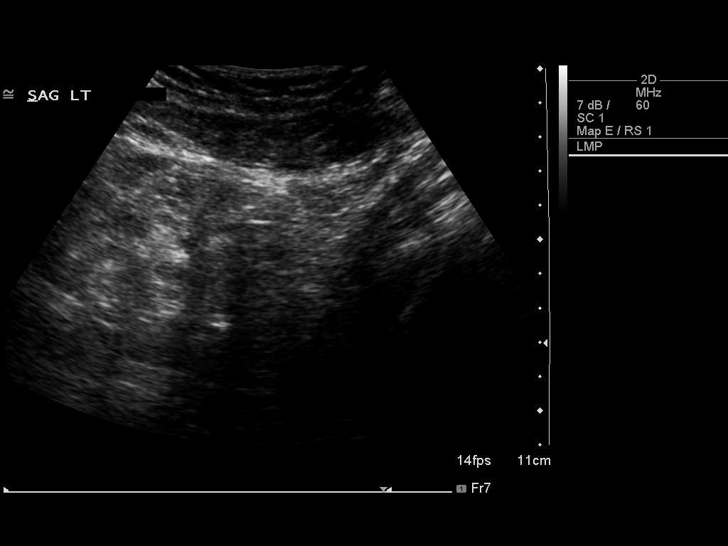
[im 23/67]
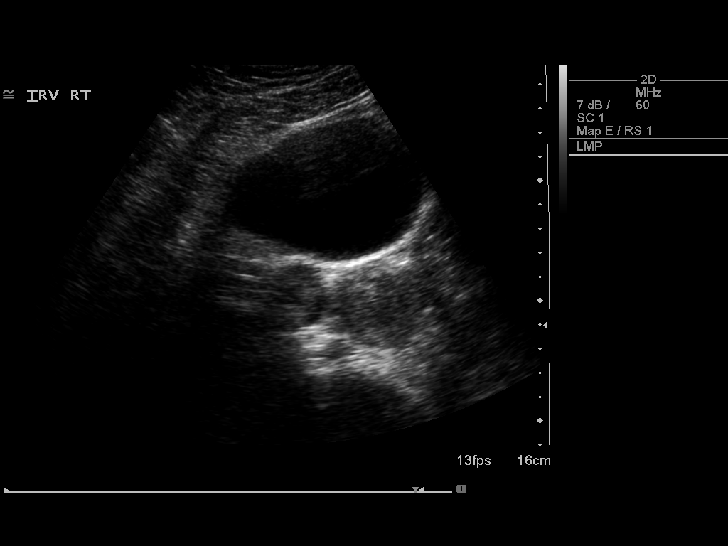
[im 28/67]
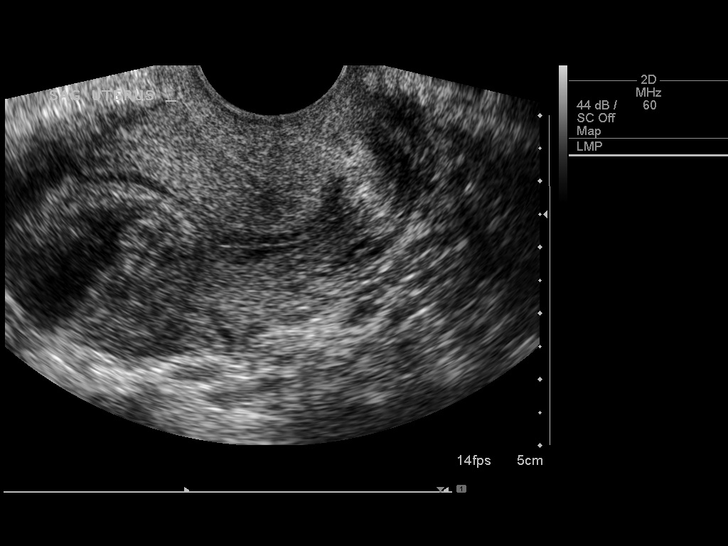
[im 34/67]
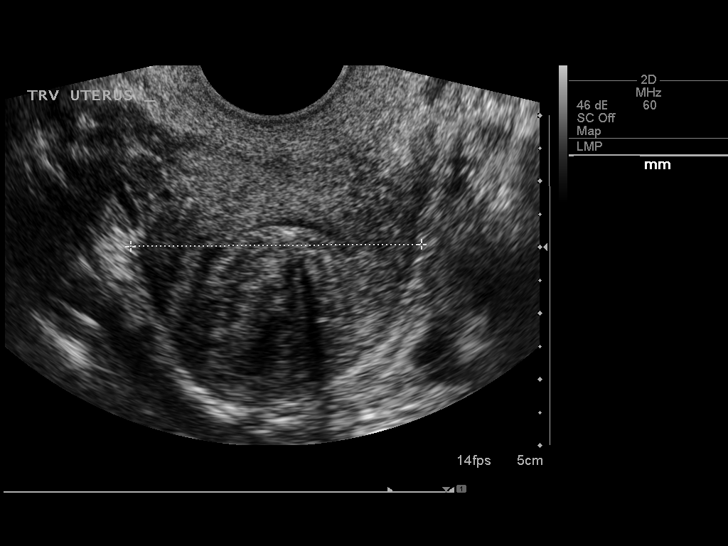
[im 39/67]
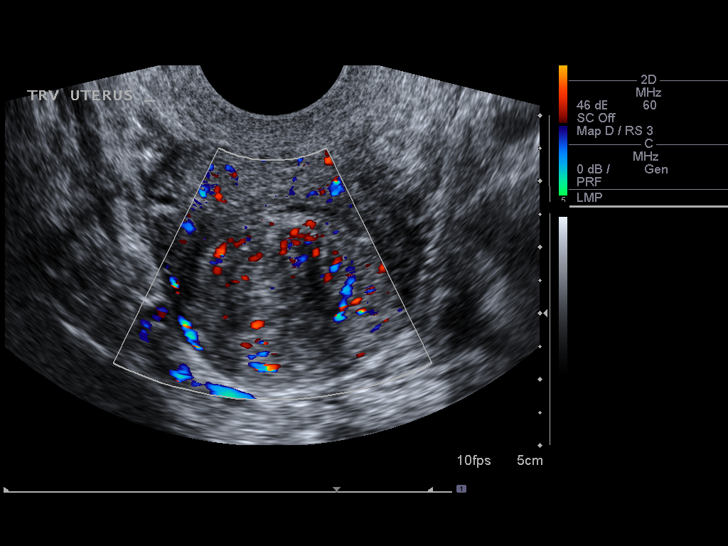
[im 45/67]
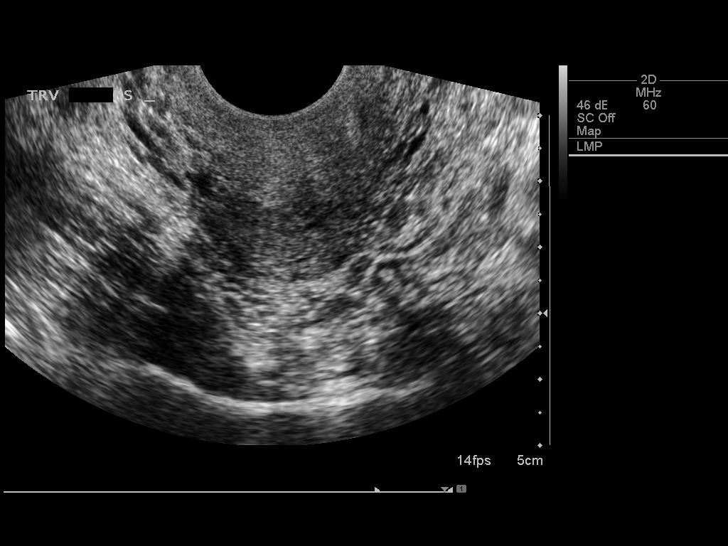
[im 50/67]
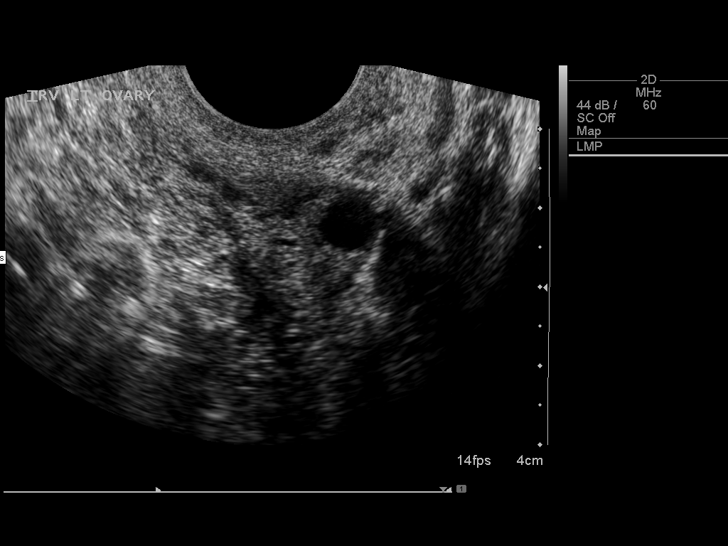
[im 56/67]
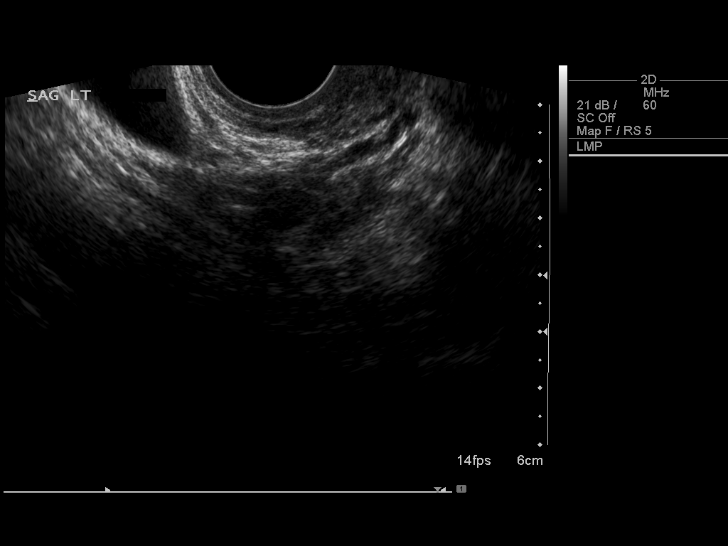
[im 61/67]
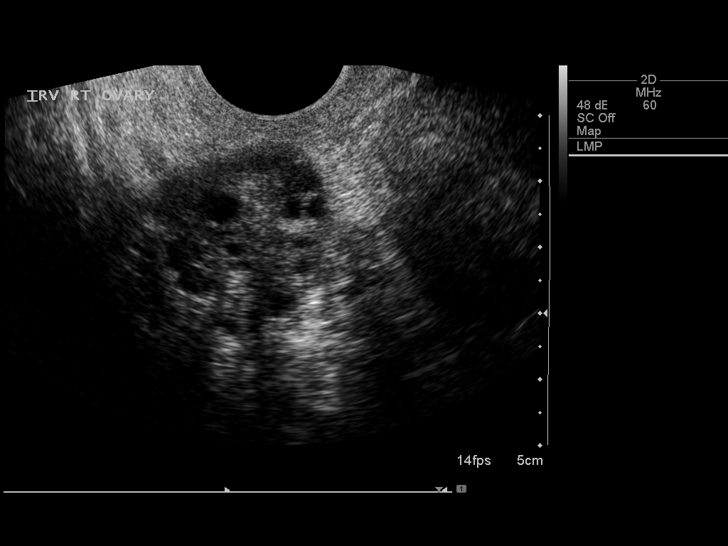
[im 67/67]
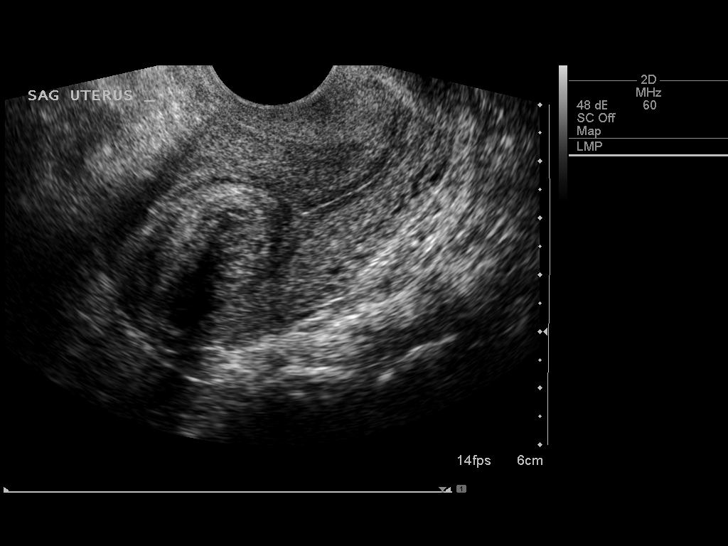

[13 of 25 positions shown; findings below may reference images not displayed]

FINDINGS: Uterus

Measurements: 7.0 x 3.5 x 4.4 cm. The uterus is anteverted and
anteflexed. There is a solitary posterior upper uterine body 2.8 x
1.9 x 2.3 cm submucosal versus intracavitary fibroid, increased in
size from 1.9 x 1.9 x 1.9 cm on 05/10/2015.

Endometrium

Endometrial visualization is significantly limited by distortion
from the adjacent fibroid. Estimated bilayer endometrial thickness
is 5 mm. Trace endometrial cavity fluid. No separate discrete
endometrial mass.

Right ovary

Measurements: 2.9 x 3.0 x 3.1 cm. Normal appearance/no adnexal mass.

Left ovary

Measurements: 2.9 x 2.3 x 2.8 cm. Normal appearance/no adnexal mass.

Other findings

No abnormal free fluid.
IMPRESSION: 1. Interval growth of solitary 2.8 cm posterior upper uterine
fibroid, which is likely submucosal or intracavitary in location.
Consider further evaluation with saline infusion sonohysterography
as clinically warranted.
2. Limited endometrial visualization due to distortion by the
fibroid, with no separate endometrial abnormality detected.
3. Normal ovaries.  No suspicious adnexal findings.

## 2016-11-07 ENCOUNTER — Other Ambulatory Visit: Payer: Self-pay | Admitting: Student

## 2016-11-07 ENCOUNTER — Encounter (HOSPITAL_COMMUNITY): Payer: Self-pay

## 2016-11-07 ENCOUNTER — Telehealth: Payer: Self-pay | Admitting: Student

## 2016-11-07 ENCOUNTER — Inpatient Hospital Stay (HOSPITAL_COMMUNITY)
Admission: AD | Admit: 2016-11-07 | Discharge: 2016-11-07 | Disposition: A | Discharge status: Home / Self Care | Payer: BLUE CROSS/BLUE SHIELD | Source: Ambulatory Visit | Attending: Obstetrics & Gynecology | Admitting: Obstetrics & Gynecology

## 2016-11-07 DIAGNOSIS — N939 Abnormal uterine and vaginal bleeding, unspecified: Secondary | ICD-10-CM | POA: Diagnosis not present

## 2016-11-07 DIAGNOSIS — D259 Leiomyoma of uterus, unspecified: Secondary | ICD-10-CM | POA: Diagnosis not present

## 2016-11-07 LAB — URINALYSIS, ROUTINE W REFLEX MICROSCOPIC
Bilirubin Urine: NEGATIVE
GLUCOSE, UA: NEGATIVE mg/dL
KETONES UR: NEGATIVE mg/dL
LEUKOCYTES UA: NEGATIVE
Nitrite: NEGATIVE
PROTEIN: NEGATIVE mg/dL
Specific Gravity, Urine: 1.025 (ref 1.005–1.030)
pH: 6 (ref 5.0–8.0)

## 2016-11-07 LAB — CBC
HCT: 25.9 % — ABNORMAL LOW (ref 36.0–46.0)
Hemoglobin: 8.3 g/dL — ABNORMAL LOW (ref 12.0–15.0)
MCH: 22.6 pg — ABNORMAL LOW (ref 26.0–34.0)
MCHC: 32 g/dL (ref 30.0–36.0)
MCV: 70.6 fL — ABNORMAL LOW (ref 78.0–100.0)
Platelets: 438 K/uL — ABNORMAL HIGH (ref 150–400)
RBC: 3.67 MIL/uL — ABNORMAL LOW (ref 3.87–5.11)
RDW: 18.8 % — ABNORMAL HIGH (ref 11.5–15.5)
WBC: 4 K/uL (ref 4.0–10.5)

## 2016-11-07 LAB — URINE MICROSCOPIC-ADD ON: WBC, UA: NONE SEEN WBC/hpf (ref 0–5)

## 2016-11-07 LAB — POCT PREGNANCY, URINE: Preg Test, Ur: NEGATIVE

## 2016-11-07 MED ORDER — MEGESTROL ACETATE 40 MG PO TABS: 40.0000 mg | ORAL_TABLET | Freq: Three times a day (TID) | ORAL | 0 refills | Status: AC

## 2016-11-07 MED ORDER — FERROUS SULFATE 325 (65 FE) MG PO TABS: 325.0000 mg | ORAL_TABLET | Freq: Two times a day (BID) | ORAL | 3 refills | Status: DC

## 2016-11-07 NOTE — Discharge Instructions (Signed)
Menorrhagia Menorrhagia is when your menstrual periods are heavy or last longer than usual. Follow these instructions at home:  Only take medicine as told by your doctor.  Take any iron pills as told by your doctor. Heavy bleeding may cause low levels of iron in your body.  Do not take aspirin 1 week before or during your period. Aspirin can make the bleeding worse.  Lie down for a while if you change your tampon or pad more than once in 2 hours. This may help lessen the bleeding.  Eat a healthy diet and foods with iron. These foods include leafy green vegetables, meat, liver, eggs, and whole grain breads and cereals.  Do not try to lose weight. Wait until the heavy bleeding has stopped and your iron level is normal. Contact a doctor if:  You soak through a pad or tampon every 1 or 2 hours, and this happens every time you have a period.  You need to use pads and tampons at the same time because you are bleeding so much.  You need to change your pad or tampon during the night.  You have a period that lasts for more than 8 days.  You pass clots bigger than 1 inch (2.5 cm) wide.  You have irregular periods that happen more or less often than once a month.  You feel dizzy or pass out (faint).  You feel very weak or tired.  You feel short of breath or feel your heart is beating too fast when you exercise.  You feel sick to your stomach (nausea) and you throw up (vomit) while you are taking your medicine.  You have watery poop (diarrhea) while you are taking your medicine.  You have any problems that may be related to the medicine you are taking. Get help right away if:  You soak through 4 or more pads or tampons in 2 hours.  You have any bleeding while you are pregnant. This information is not intended to replace advice given to you by your health care provider. Make sure you discuss any questions you have with your health care provider. Document Released: 09/01/2008 Document  Revised: 04/30/2016 Document Reviewed: 05/25/2013 Elsevier Interactive Patient Education  2017 [employer].   Patient should take 40 mg of megace 3 times a day for 10 days.

## 2016-11-07 NOTE — MAU Note (Addendum)
Patient presents with history of uterine fibroids, wants something to stop the bleeding and something for pain. Had cycle 11/16-11/26 stopped 11/27 and 11/28 started back on 11/30, Ibuprofen is not helping with the pain, pain wants surgery for removal of fibroids. Heavy vaginal bleeding passing golf ball size clots changing a pad every hour to hour and a half.

## 2016-11-07 NOTE — MAU Provider Note (Signed)
History     CSN: [id number]  Arrival date and time: 11/07/16 1208   First Provider Initiated Contact with Patient 11/07/16 1414      Chief Complaint  Patient presents with  . Vaginal Bleeding   Vaginal Bleeding  The patient's primary symptoms include vaginal bleeding. The patient's pertinent negatives include no genital itching, genital lesions, genital odor, genital rash, missed menses, pelvic pain or vaginal discharge. This is a recurrent problem. The current episode started 1 to 4 weeks ago. The problem occurs constantly. The problem has been unchanged. The pain is moderate. She is not pregnant. Associated symptoms include abdominal pain. Pertinent negatives include no anorexia, back pain, chills, constipation, diarrhea, discolored urine, dysuria, fever, flank pain, frequency, headaches, hematuria, joint pain, joint swelling, nausea, painful intercourse, rash, sore throat, urgency or vomiting. The vaginal bleeding is typical of menses. She has been passing clots. She has not been passing tissue. She has tried NSAIDs for the symptoms. The treatment provided mild relief. Her menstrual history has been irregular.   Patient Audrey Lang has a history of fibroids and had an unsuccessful fibroid surgery earlier this year. Her period was from 11/15 to 11/26, and now has returned. She wants something to stop the bleeding.  OB History    Gravida Para Term Preterm AB Living   0 0 0 0 0 0   SAB TAB Ectopic Multiple Live Births   0 0 0 0        Past Medical History:  Diagnosis Date  . Anemia   . Asthma   . Seizures ([place]) 2009   last seizure 2011    Past Surgical History:  Procedure Laterality Date  . MYOMECTOMY     in [place]  . TONSILLECTOMY  2003    Family History  Problem Relation Age of Onset  . Ulcers Mother   . Asthma Sister     Social History  Substance Use Topics  . Smoking status: Never Smoker  . Smokeless tobacco: Never Used  . Alcohol use 0.5 oz/week    1  Standard drinks or equivalent per week    Allergies:  Allergies  Allergen Reactions  . Lamictal [Lamotrigine] Rash    Prescriptions Prior to Admission  Medication Sig Dispense Refill Last Dose  . ibuprofen (ADVIL,MOTRIN) 200 MG tablet Take 400 mg by mouth every 6 (six) hours as needed for headache, mild pain or moderate pain.   11/06/2016 at 1200    Review of Systems  Constitutional: Negative for chills and fever.  HENT: Negative for sore throat.   Eyes: Negative.   Respiratory: Negative.   Cardiovascular: Negative.   Gastrointestinal: Positive for abdominal pain. Negative for anorexia, constipation, diarrhea, nausea and vomiting.  Genitourinary: Positive for vaginal bleeding. Negative for dysuria, flank pain, frequency, hematuria, missed menses, pelvic pain, urgency and vaginal discharge.  Musculoskeletal: Negative for back pain and joint pain.  Skin: Negative for rash.  Neurological: Negative for headaches.   Physical Exam   Blood pressure 125/88, pulse 92, temperature 97.8 F (36.6 C), temperature source Oral, resp. rate 16, height 5\' 1"  (1.549 m), weight 128 lb (58.1 kg), last menstrual period 10/15/2016.  Physical Exam  Constitutional: She is oriented to person, place, and time. She appears well-developed.  HENT:  Head: Normocephalic.  Neck: Normal range of motion.  Respiratory: Effort normal. No respiratory distress. She has no wheezes.  GI: Soft. She exhibits no distension and no mass. There is no tenderness. There is no rebound and no  guarding.  Genitourinary: Vagina normal and uterus normal. Rectal exam shows guaiac negative stool. No vaginal discharge found.  Genitourinary Comments: Cervix is pink and non-erthematous; no lesions. Vaginal walls pink with no lesions, no vaginal discharge and no odor. Small amount of blood in the vaginal vault. No CMT.   Neurological: She is alert and oriented to person, place, and time.  Skin: Skin is warm and dry.    MAU Course   Procedures  MDM -CBC -GC CT   Assessment and Plan  -encouraged patient to establish care with a gyn provider in the area. CBC is 8.3/25/9. Discharged home with a script for Megace 40 TID for 10 days and instructions to stop after 10 days.    Mervyn Skeeters Kooistra CNM 11/07/2016, 2:14 PM

## 2016-11-09 LAB — GC/CHLAMYDIA PROBE AMP (~~LOC~~) NOT AT ARMC
Chlamydia: NEGATIVE
NEISSERIA GONORRHEA: NEGATIVE

## 2016-11-12 ENCOUNTER — Telehealth: Payer: Self-pay

## 2016-11-12 DIAGNOSIS — D259 Leiomyoma of uterus, unspecified: Secondary | ICD-10-CM

## 2016-11-12 NOTE — Telephone Encounter (Signed)
Per Dr. Gala Romney, pt needs to be scheduled for a saline sono, follow up with Clinic after sono.  Pt also needs iron po bid and Colace/Miralax as needed to prevent constipation.  Attempted to contact pt and was unable to leave message due to phone kept ringing.  Re: Pt needs to be told to call [phone] on first day period to scheduled saline sono.

## 2016-11-17 NOTE — Telephone Encounter (Signed)
Called patient, no answer- unable to leave message. Called emergency contact and left message for patient to call us back regarding an appt. Will send letter

## 2017-01-01 ENCOUNTER — Encounter (HOSPITAL_COMMUNITY): Payer: Self-pay

## 2017-01-01 ENCOUNTER — Observation Stay (HOSPITAL_COMMUNITY)
Admission: AD | Admit: 2017-01-01 | Discharge: 2017-01-01 | Disposition: A | Discharge status: Home / Self Care | Payer: BLUE CROSS/BLUE SHIELD | Source: Ambulatory Visit | Attending: Obstetrics and Gynecology | Admitting: Obstetrics and Gynecology

## 2017-01-01 DIAGNOSIS — J45909 Unspecified asthma, uncomplicated: Secondary | ICD-10-CM | POA: Insufficient documentation

## 2017-01-01 DIAGNOSIS — N939 Abnormal uterine and vaginal bleeding, unspecified: Secondary | ICD-10-CM | POA: Diagnosis not present

## 2017-01-01 DIAGNOSIS — D649 Anemia, unspecified: Secondary | ICD-10-CM | POA: Diagnosis not present

## 2017-01-01 LAB — PREPARE RBC (CROSSMATCH)

## 2017-01-01 LAB — ABO/RH: ABO/RH(D): O POS

## 2017-01-01 MED ORDER — DIPHENHYDRAMINE HCL 25 MG PO CAPS
25.0000 mg | ORAL_CAPSULE | Freq: Once | ORAL | Status: AC
  Administered 2017-01-01: 25 mg via ORAL
  Filled 2017-01-01: qty 1

## 2017-01-01 MED ORDER — ACETAMINOPHEN 325 MG PO TABS
650.0000 mg | ORAL_TABLET | Freq: Once | ORAL | Status: AC
  Administered 2017-01-01: 650 mg via ORAL
  Filled 2017-01-01: qty 2

## 2017-01-01 MED ORDER — SODIUM CHLORIDE 0.9 % IV SOLN
Freq: Once | INTRAVENOUS | Status: AC
  Administered 2017-01-01: 11:00:00 via INTRAVENOUS

## 2017-01-01 NOTE — Progress Notes (Signed)
Patient arrived for pRBC. Symptomatic hgb of 6.9 from vaginal bleeding s/s fibroids. Not currently bleeding. Risks discussed including transfusion reaction and HIV/Hepatitis infection. All questions answered in office. Proceed with pre-medication and 1upRBC.    Lucillie Garfinkel MD

## 2017-01-01 NOTE — Progress Notes (Signed)
Pt discharged to home, driving herself.  Condition stable. Pt ambulated off unit to go to car.  No equipment for home ordered at discharge.

## 2017-01-02 LAB — TYPE AND SCREEN
BLOOD PRODUCT EXPIRATION DATE: 201802072359
ISSUE DATE / TIME: 201801261210
UNIT TYPE AND RH: 5100

## 2017-01-11 ENCOUNTER — Encounter (HOSPITAL_COMMUNITY): Payer: Self-pay | Admitting: *Deleted

## 2017-01-11 ENCOUNTER — Inpatient Hospital Stay (HOSPITAL_COMMUNITY)
Admission: AD | Admit: 2017-01-11 | Discharge: 2017-01-11 | Disposition: A | Discharge status: Home / Self Care | Payer: BLUE CROSS/BLUE SHIELD | Source: Ambulatory Visit | Attending: Obstetrics & Gynecology | Admitting: Obstetrics & Gynecology

## 2017-01-11 DIAGNOSIS — D250 Submucous leiomyoma of uterus: Secondary | ICD-10-CM

## 2017-01-11 DIAGNOSIS — Z825 Family history of asthma and other chronic lower respiratory diseases: Secondary | ICD-10-CM | POA: Insufficient documentation

## 2017-01-11 DIAGNOSIS — R569 Unspecified convulsions: Secondary | ICD-10-CM | POA: Insufficient documentation

## 2017-01-11 DIAGNOSIS — D259 Leiomyoma of uterus, unspecified: Secondary | ICD-10-CM | POA: Diagnosis not present

## 2017-01-11 DIAGNOSIS — Z888 Allergy status to other drugs, medicaments and biological substances status: Secondary | ICD-10-CM | POA: Diagnosis not present

## 2017-01-11 DIAGNOSIS — Z91018 Allergy to other foods: Secondary | ICD-10-CM | POA: Insufficient documentation

## 2017-01-11 DIAGNOSIS — Z3202 Encounter for pregnancy test, result negative: Secondary | ICD-10-CM | POA: Diagnosis not present

## 2017-01-11 DIAGNOSIS — N939 Abnormal uterine and vaginal bleeding, unspecified: Secondary | ICD-10-CM

## 2017-01-11 DIAGNOSIS — D500 Iron deficiency anemia secondary to blood loss (chronic): Secondary | ICD-10-CM

## 2017-01-11 DIAGNOSIS — D251 Intramural leiomyoma of uterus: Secondary | ICD-10-CM

## 2017-01-11 DIAGNOSIS — D252 Subserosal leiomyoma of uterus: Secondary | ICD-10-CM

## 2017-01-11 HISTORY — DX: Benign neoplasm of connective and other soft tissue, unspecified: D21.9

## 2017-01-11 LAB — URINALYSIS, ROUTINE W REFLEX MICROSCOPIC
BACTERIA UA: NONE SEEN
Bilirubin Urine: NEGATIVE
Glucose, UA: NEGATIVE mg/dL
Ketones, ur: NEGATIVE mg/dL
Nitrite: NEGATIVE
Protein, ur: 100 mg/dL — AB
SPECIFIC GRAVITY, URINE: 1.017 (ref 1.005–1.030)
Squamous Epithelial / LPF: NONE SEEN
pH: 6 (ref 5.0–8.0)

## 2017-01-11 LAB — CBC WITH DIFFERENTIAL/PLATELET
Basophils Absolute: 0 10*3/uL (ref 0.0–0.1)
Basophils Relative: 0 %
Eosinophils Absolute: 0.1 10*3/uL (ref 0.0–0.7)
Eosinophils Relative: 1 %
HCT: 27.7 % — ABNORMAL LOW (ref 36.0–46.0)
HEMOGLOBIN: 9 g/dL — AB (ref 12.0–15.0)
LYMPHS ABS: 3.1 10*3/uL (ref 0.7–4.0)
LYMPHS PCT: 39 %
MCH: 22.9 pg — AB (ref 26.0–34.0)
MCHC: 32.5 g/dL (ref 30.0–36.0)
MCV: 70.5 fL — AB (ref 78.0–100.0)
Monocytes Absolute: 0.3 10*3/uL (ref 0.1–1.0)
Monocytes Relative: 3 %
NEUTROS PCT: 56 %
Neutro Abs: 4.4 10*3/uL (ref 1.7–7.7)
Platelets: 302 10*3/uL (ref 150–400)
RBC: 3.93 MIL/uL (ref 3.87–5.11)
RDW: 21.3 % — ABNORMAL HIGH (ref 11.5–15.5)
WBC: 7.9 10*3/uL (ref 4.0–10.5)

## 2017-01-11 LAB — POCT PREGNANCY, URINE: PREG TEST UR: NEGATIVE

## 2017-01-11 MED ORDER — OXYCODONE-ACETAMINOPHEN 5-325 MG PO TABS: 1.0000 | ORAL_TABLET | Freq: Three times a day (TID) | ORAL | 0 refills | Status: DC

## 2017-01-11 NOTE — MAU Provider Note (Signed)
History     CSN: [id number]  Arrival date and time: 01/11/17 1856   None     Chief Complaint  Patient presents with  . Vaginal Bleeding   HPI Audrey Lang is 26 y.o. G0P0000 presents for heavy vaginal bleeding.  Soaking through 5 pads quickly today. Cramping associated with bleeding.  Bleeding less while in MAU.  Had blood transfusion on  01/02/17.  HGB on 11/07/16 8.3 Hx of fibroids with myomectomy summer of 2016. U/S done 1/27 showed 2.8 cm posterior upper uterine fibroid, likely submucosal or intracavitary. States it takes 2 1600mg  Advil tabs to control pain. She is a patient of [facility], has a pre-op appt with Dr. Royston Sinner for next week.      Past Medical History:  Diagnosis Date  . Anemia   . Asthma   . Seizures ([place]) 2009   last seizure 2011    Past Surgical History:  Procedure Laterality Date  . MYOMECTOMY     in [place]  . TONSILLECTOMY  2003    Family History  Problem Relation Age of Onset  . Ulcers Mother   . Asthma Sister     Social History  Substance Use Topics  . Smoking status: Never Smoker  . Smokeless tobacco: Never Used  . Alcohol use 0.5 oz/week    1 Standard drinks or equivalent per week    Allergies:  Allergies  Allergen Reactions  . Banana Swelling  . Lamictal [Lamotrigine] Rash    Prescriptions Prior to Admission  Medication Sig Dispense Refill Last Dose  . Bioflavonoid Products (VITAMIN C) CHEW Chew 1 each by mouth daily.   12/31/2016 at Unknown time    Review of Systems  Constitutional: Negative for appetite change and fever.  Respiratory: Negative for shortness of breath.   Cardiovascular: Negative for chest pain.  Genitourinary: Positive for menstrual problem (cramping), pelvic pain and vaginal bleeding.  Neurological: Negative for dizziness.   Physical Exam   Blood pressure 133/95, pulse 103, temperature 98 F (36.7 C), temperature source Oral, resp. rate 18, height 5' (1.524 m), weight 132 lb (59.9  kg).  Physical Exam  Constitutional: She is oriented to person, place, and time. She appears well-developed and well-nourished. No distress.  HENT:  Head: Normocephalic.  Neck: Normal range of motion.  Cardiovascular:  Pulse 103  Respiratory: Effort normal.  GI: Soft. There is no tenderness. There is no rebound and no guarding.  Genitourinary: There is no rash, tenderness or lesion on the right labia. There is no rash, tenderness or lesion on the left labia. Uterus is enlarged (slightly) and tender. Cervix exhibits no motion tenderness, no discharge and no friability. Right adnexum displays no mass, no tenderness and no fullness. Left adnexum displays no mass, no tenderness and no fullness. There is tenderness and bleeding (small amount of bright red blood with several small clots. Small amount of Blood is seen coming from the os. ) in the vagina. No erythema in the vagina. No vaginal discharge found.  Neurological: She is alert and oriented to person, place, and time.  Skin: Skin is warm and dry.  Psychiatric: She has a normal mood and affect. Her behavior is normal. Judgment and thought content normal.     Results for orders placed or performed during the hospital encounter of 01/11/17 (from the past 24 hour(s))  Urinalysis, Routine w reflex microscopic     Status: Abnormal   Collection Time: 01/11/17  7:18 PM  Result Value Ref Range  Color, Urine BIOCHEMICALS MAY BE AFFECTED BY COLOR (A) YELLOW   APPearance HAZY (A) CLEAR   Specific Gravity, Urine 1.017 1.005 - 1.030   pH 6.0 5.0 - 8.0   Glucose, UA NEGATIVE NEGATIVE mg/dL   Hgb urine dipstick LARGE (A) NEGATIVE   Bilirubin Urine NEGATIVE NEGATIVE   Ketones, ur NEGATIVE NEGATIVE mg/dL   Protein, ur 100 (A) NEGATIVE mg/dL   Nitrite NEGATIVE NEGATIVE   Leukocytes, UA TRACE (A) NEGATIVE   RBC / HPF TOO NUMEROUS TO COUNT 0 - 5 RBC/hpf   WBC, UA 0-5 0 - 5 WBC/hpf   Bacteria, UA NONE SEEN NONE SEEN   Squamous Epithelial / LPF NONE  SEEN NONE SEEN  Pregnancy, urine POC     Status: None   Collection Time: 01/11/17  7:30 PM  Result Value Ref Range   Preg Test, Ur NEGATIVE NEGATIVE  CBC with Differential/Platelet     Status: Abnormal   Collection Time: 01/11/17  7:34 PM  Result Value Ref Range   WBC 7.9 4.0 - 10.5 K/uL   RBC 3.93 3.87 - 5.11 MIL/uL   Hemoglobin 9.0 (L) 12.0 - 15.0 g/dL   HCT 27.7 (L) 36.0 - 46.0 %   MCV 70.5 (L) 78.0 - 100.0 fL   MCH 22.9 (L) 26.0 - 34.0 pg   MCHC 32.5 30.0 - 36.0 g/dL   RDW 21.3 (H) 11.5 - 15.5 %   Platelets 302 150 - 400 K/uL   Neutrophils Relative % 56 %   Neutro Abs 4.4 1.7 - 7.7 K/uL   Lymphocytes Relative 39 %   Lymphs Abs 3.1 0.7 - 4.0 K/uL   Monocytes Relative 3 %   Monocytes Absolute 0.3 0.1 - 1.0 K/uL   Eosinophils Relative 1 %   Eosinophils Absolute 0.1 0.0 - 0.7 K/uL   Basophils Relative 0 %   Basophils Absolute 0.0 0.0 - 0.1 K/uL   BLOOD TYPE from previous record O positive  MAU Course  Procedures  MDM MSE Labs Exam Consulted with Dr. Lynnette Caffey at 20:55  Reports Vital signs, lab, and physical exam finding. Order given for Percocet 5mg  tabs po q8hrs prn #5 tabs, no refills.  Instruct her to take po Fe and stay well hydrated.   Patient stable at time of discharge.  Assessment and Plan  A:  Abnormal Vaginal Bleeding       Anemia from blood loss       Uterine fibroid  P:  Instructed to take Fe OTC until seen in office       Stay well hydrated      Call the office for worsening bleeding.      Keep scheduled pre-op appointment with Dr. Marcie Bal M Key 01/11/2017, 7:24 PM

## 2017-01-11 NOTE — MAU Note (Signed)
Pt presents complaining of heavy vaginal bleeding that started at work today with large clots and increased abdominal pain. States she was started on progestin on Friday and feels like that made the bleeding worse. States she is going to have surgery for the fibroids. Recently admitted for a blood transfusion.

## 2017-01-11 NOTE — Discharge Instructions (Signed)
Anemia, Nonspecific Anemia is a condition in which the concentration of red blood cells or hemoglobin in the blood is below normal. Hemoglobin is a substance in red blood cells that carries oxygen to the tissues of the body. Anemia results in not enough oxygen reaching these tissues. What are the causes? Common causes of anemia include:  Excessive bleeding. Bleeding may be internal or external. This includes excessive bleeding from periods (in women) or from the intestine.  Poor nutrition.  Chronic kidney, thyroid, and liver disease.  Bone marrow disorders that decrease red blood cell production.  Cancer and treatments for cancer.  HIV, AIDS, and their treatments.  Spleen problems that increase red blood cell destruction.  Blood disorders.  Excess destruction of red blood cells due to infection, medicines, and autoimmune disorders. What are the signs or symptoms?  Minor weakness.  Dizziness.  Headache.  Palpitations.  Shortness of breath, especially with exercise.  Paleness.  Cold sensitivity.  Indigestion.  Nausea.  Difficulty sleeping.  Difficulty concentrating. Symptoms may occur suddenly or they may develop slowly. How is this diagnosed? Additional blood tests are often needed. These help your health care provider determine the best treatment. Your health care provider will check your stool for blood and look for other causes of blood loss. How is this treated? Treatment varies depending on the cause of the anemia. Treatment can include:  Supplements of iron, vitamin 123456, or folic acid.  Hormone medicines.  A blood transfusion. This may be needed if blood loss is severe.  Hospitalization. This may be needed if there is significant continual blood loss.  Dietary changes.  Spleen removal. Follow these instructions at home: Keep all follow-up appointments. It often takes many weeks to correct anemia, and having your health care provider check on your  condition and your response to treatment is very important. Get help right away if:  You develop extreme weakness, shortness of breath, or chest pain.  You become dizzy or have trouble concentrating.  You develop heavy vaginal bleeding.  You develop a rash.  You have bloody or black, tarry stools.  You faint.  You vomit up blood.  You vomit repeatedly.  You have abdominal pain.  You have a fever or persistent symptoms for more than 2-3 days.  You have a fever and your symptoms suddenly get worse.  You are dehydrated. This information is not intended to replace advice given to you by your health care provider. Make sure you discuss any questions you have with your health care provider. Document Released: 12/31/2004 Document Revised: 05/06/2016 Document Reviewed: 05/19/2013 Elsevier Interactive Patient Education  2017 Else[employer] Inc.  Uterine Fibroids Uterine fibroids are tissue masses (tumors). They are also called leiomyomas. They can develop inside of a womans womb (uterus). They can grow very large. Fibroids are not cancerous (benign). Most fibroids do not require medical treatment. Follow these instructions at home:  Keep all follow-up visits as told by your doctor. This is important.  Take medicines only as told by your doctor.  If you were prescribed a hormone treatment, take the hormone medicines exactly as told.  Do not take aspirin. It can cause bleeding.  Ask your doctor about taking iron pills and increasing the amount of dark green, leafy vegetables in your diet. These actions can help to boost your blood iron levels.  Pay close attention to your period. Tell your doctor about any changes, such as:  Increased blood flow. This may require you to use more pads or tampons  than usual per month.  A change in the number of days that your period lasts per month.  A change in symptoms that come with your period, such as back pain or cramping in your belly area  (abdomen). Contact a doctor if:  You have pain in your back or the area between your hip bones (pelvic area) that is not controlled by medicines.  You have pain in your abdomen that is not controlled with medicines.  You have an increase in bleeding between and during periods.  You soak tampons or pads in a half hour or less.  You feel lightheaded.  You feel extra tired.  You feel weak. Get help right away if:  You pass out (faint).  You have a sudden increase in pelvic pain. This information is not intended to replace advice given to you by your health care provider. Make sure you discuss any questions you have with your health care provider. Document Released: 12/26/2010 Document Revised: 07/24/2016 Document Reviewed: 05/22/2014 Elsevier Interactive Patient Education  2017 Elsevier Inc.  Abnormal Uterine Bleeding Abnormal uterine bleeding means bleeding from the vagina that is not your normal menstrual period. This can be:  Bleeding or spotting between periods.  Bleeding after sex (sexual intercourse).  Bleeding that is heavier or more than normal.  Periods that last longer than usual.  Bleeding after menopause. There are many problems that may cause this. Treatment will depend on the cause of the bleeding. Any kind of bleeding that is not normal should be reviewed by your doctor. Follow these instructions at home: Watch your condition for any changes. These actions may lessen any discomfort you are having:  Do not use tampons or douches as told by your doctor.  Change your pads often. You should get regular pelvic exams and Pap tests. Keep all appointments for tests as told by your doctor. Contact a doctor if:  You are bleeding for more than 1 week.  You feel dizzy at times. Get help right away if:  You pass out.  You have to change pads every 15 to 30 minutes.  You have belly pain.  You have a fever.  You become sweaty or weak.  You are passing large  blood clots from the vagina.  You feel sick to your stomach (nauseous) and throw up (vomit). This information is not intended to replace advice given to you by your health care provider. Make sure you discuss any questions you have with your health care provider. Document Released: 09/20/2009 Document Revised: 04/30/2016 Document Reviewed: 06/22/2013 Elsevier Interactive Patient Education  2017 [employer].

## 2017-01-13 ENCOUNTER — Observation Stay (HOSPITAL_COMMUNITY)
Admission: AD | Admit: 2017-01-13 | Discharge: 2017-01-14 | Disposition: A | Discharge status: Home / Self Care | Payer: BLUE CROSS/BLUE SHIELD | Source: Ambulatory Visit | Attending: Obstetrics and Gynecology | Admitting: Obstetrics and Gynecology

## 2017-01-13 ENCOUNTER — Encounter (HOSPITAL_COMMUNITY): Payer: Self-pay | Admitting: *Deleted

## 2017-01-13 DIAGNOSIS — D259 Leiomyoma of uterus, unspecified: Secondary | ICD-10-CM

## 2017-01-13 DIAGNOSIS — D649 Anemia, unspecified: Principal | ICD-10-CM | POA: Insufficient documentation

## 2017-01-13 DIAGNOSIS — D219 Benign neoplasm of connective and other soft tissue, unspecified: Secondary | ICD-10-CM

## 2017-01-13 DIAGNOSIS — N939 Abnormal uterine and vaginal bleeding, unspecified: Secondary | ICD-10-CM

## 2017-01-13 DIAGNOSIS — J45909 Unspecified asthma, uncomplicated: Secondary | ICD-10-CM | POA: Insufficient documentation

## 2017-01-13 LAB — URINALYSIS, ROUTINE W REFLEX MICROSCOPIC

## 2017-01-13 LAB — CBC
HCT: 23.1 % — ABNORMAL LOW (ref 36.0–46.0)
HEMOGLOBIN: 7.5 g/dL — AB (ref 12.0–15.0)
MCH: 22.8 pg — ABNORMAL LOW (ref 26.0–34.0)
MCHC: 32.5 g/dL (ref 30.0–36.0)
MCV: 70.2 fL — ABNORMAL LOW (ref 78.0–100.0)
Platelets: 293 10*3/uL (ref 150–400)
RBC: 3.29 MIL/uL — AB (ref 3.87–5.11)
RDW: 21.1 % — ABNORMAL HIGH (ref 11.5–15.5)
WBC: 8 10*3/uL (ref 4.0–10.5)

## 2017-01-13 LAB — URINALYSIS, MICROSCOPIC (REFLEX)

## 2017-01-13 LAB — POCT PREGNANCY, URINE: PREG TEST UR: NEGATIVE

## 2017-01-13 LAB — PREPARE RBC (CROSSMATCH)

## 2017-01-13 MED ORDER — KETOROLAC TROMETHAMINE 60 MG/2ML IM SOLN
60.0000 mg | Freq: Once | INTRAMUSCULAR | Status: AC
  Administered 2017-01-13: 60 mg via INTRAMUSCULAR
  Filled 2017-01-13: qty 2

## 2017-01-13 MED ORDER — ESTROGENS CONJUGATED 25 MG IJ SOLR
25.0000 mg | Freq: Four times a day (QID) | INTRAMUSCULAR | Status: AC | PRN
  Administered 2017-01-13 – 2017-01-14 (×2): 25 mg via INTRAVENOUS
  Filled 2017-01-13 (×4): qty 25

## 2017-01-13 MED ORDER — SODIUM CHLORIDE 0.9 % IV SOLN
Freq: Once | INTRAVENOUS | Status: AC
  Administered 2017-01-13: 20:00:00 via INTRAVENOUS

## 2017-01-13 MED ORDER — ACETAMINOPHEN 325 MG PO TABS
650.0000 mg | ORAL_TABLET | Freq: Once | ORAL | Status: AC
  Administered 2017-01-13: 650 mg via ORAL
  Filled 2017-01-13: qty 2

## 2017-01-13 MED ORDER — DIPHENHYDRAMINE HCL 25 MG PO CAPS
25.0000 mg | ORAL_CAPSULE | Freq: Once | ORAL | Status: AC
  Administered 2017-01-13: 25 mg via ORAL
  Filled 2017-01-13: qty 1

## 2017-01-13 MED ORDER — SODIUM CHLORIDE 0.9 % IV SOLN
INTRAVENOUS | Status: DC
  Administered 2017-01-13: 18:00:00 via INTRAVENOUS

## 2017-01-13 NOTE — MAU Provider Note (Signed)
Chief Complaint: No chief complaint on file.   First Provider Initiated Contact with Patient 01/13/17 1545      SUBJECTIVE HPI: Audrey Lang is a 26 y.o. G1P0010 who presents to maternity admissions reporting heavy vaginal bleeding in gushes today, soaking through pads and onto her clothes several times today. This is a recurrent symptom.  She reports the bleeding is associated with menstrual cramping and dizziness and shortness of breath with exertion. She has a myomectomy scheduled 2/22 and is taking Megace, started yesterday. Her bleeding slowed a little after she started the medication but then today it became heavy again.  She has tried OCPs and Provera but these have not helped her bleeding. She is concerned that estrogen may make her fibroids grow so has declined this recently for treatment.  She has not tried any treatments for pain. Her cramping is worse when bleeding is heavy, improved when bleeding slows.   She denies vaginal itching/burning, urinary symptoms, h/a, n/v, or fever/chills.     HPI  Past Medical History:  Diagnosis Date  . Anemia   . Asthma   . Fibroid   . Seizures ([facility]) 2009   last seizure 2011   Past Surgical History:  Procedure Laterality Date  . [place]ECTOMY     in [place]  . TONSILLECTOMY  2003   Social History   Social History  . Marital status: Single    Spouse name: n/a  . Number of children: 0  . Years of education: N/A   Occupational History  . Student     [employer]   Social History Main Topics  . Smoking status: Never Smoker  . Smokeless tobacco: Never Used  . Alcohol use 0.5 oz/week    1 Standard drinks or equivalent per week  . Drug use: No  . Sexual activity: Yes    Partners: Male    Birth control/ protection: Condom   Other Topics Concern  . Not on file   Social History Narrative   Boyfriend lives in [place].   No current facility-administered medications on file prior to encounter.     Current Outpatient Prescriptions on File Prior to Encounter  Medication Sig Dispense Refill  . Bioflavonoid Products (VITAMIN C) CHEW Chew 2 each by mouth daily.     . medroxyPROGESTERone (PROVERA) 10 MG tablet Take 10-30 mg by mouth daily. Take 3 tablets 1 time a day for 3 days. Take 1 two times a day for two days and then take 1 tablet 1 time a day for 5 days.     Marland Kitchen oxyCODONE-acetaminophen (ROXICET) 5-325 MG tablet Take 1 tablet by mouth every 8 (eight) hours. PRN for pain 5 tablet 0   Allergies  Allergen Reactions  . Banana Swelling  . Lamictal [Lamotrigine] Rash    ROS:  Review of Systems  Constitutional: Positive for fatigue. Negative for chills and fever.  Respiratory: Positive for shortness of breath.   Cardiovascular: Negative for chest pain.  Genitourinary: Positive for pelvic pain and vaginal bleeding. Negative for difficulty urinating, dysuria, flank pain, vaginal discharge and vaginal pain.  Neurological: Positive for dizziness. Negative for headaches.  Psychiatric/Behavioral: Negative.      I have reviewed patient's Past Medical Hx, Surgical Hx, Family Hx, Social Hx, medications and allergies.   Physical Exam   Patient Vitals for the past 24 hrs:  BP Temp Pulse Resp SpO2  01/13/17 1521 - - - - 100 %  01/13/17 1520 116/81 98.9 F (37.2  C) (!) 121 16 -   Constitutional: Well-developed, well-nourished female in no acute distress.  Cardiovascular: normal rate Respiratory: normal effort GI: Abd soft, non-tender. Pos BS x 4 MS: Extremities nontender, no edema, normal ROM Neurologic: Alert and oriented x 4.  GU: Neg CVAT.  PELVIC EXAM: Cervix pink, visually closed, without lesion, moderate amount dark red bleeding with small clots, vaginal walls and external genitalia normal Bimanual exam: Cervix 0/long/high, firm, anterior, neg CMT, uterus mildly tender, slightly enlarged and irregular in shape, adnexa without tenderness, enlargement, or mass   LAB  RESULTS Results for orders placed or performed during the hospital encounter of 01/13/17 (from the past 24 hour(s))  Urinalysis, Routine w reflex microscopic     Status: Abnormal   Collection Time: 01/13/17  3:05 PM  Result Value Ref Range   Color, Urine RED (A) YELLOW   APPearance CLOUDY (A) CLEAR   Specific Gravity, Urine  1.005 - 1.030    TEST NOT REPORTED DUE TO COLOR INTERFERENCE OF URINE PIGMENT   pH  5.0 - 8.0    TEST NOT REPORTED DUE TO COLOR INTERFERENCE OF URINE PIGMENT   Glucose, UA (A) NEGATIVE mg/dL    TEST NOT REPORTED DUE TO COLOR INTERFERENCE OF URINE PIGMENT   Hgb urine dipstick (A) NEGATIVE    TEST NOT REPORTED DUE TO COLOR INTERFERENCE OF URINE PIGMENT   Bilirubin Urine (A) NEGATIVE    TEST NOT REPORTED DUE TO COLOR INTERFERENCE OF URINE PIGMENT   Ketones, ur (A) NEGATIVE mg/dL    TEST NOT REPORTED DUE TO COLOR INTERFERENCE OF URINE PIGMENT   Protein, ur (A) NEGATIVE mg/dL    TEST NOT REPORTED DUE TO COLOR INTERFERENCE OF URINE PIGMENT   Nitrite (A) NEGATIVE    TEST NOT REPORTED DUE TO COLOR INTERFERENCE OF URINE PIGMENT   Leukocytes, UA (A) NEGATIVE    TEST NOT REPORTED DUE TO COLOR INTERFERENCE OF URINE PIGMENT  Urinalysis, Microscopic (reflex)     Status: Abnormal   Collection Time: 01/13/17  3:05 PM  Result Value Ref Range   RBC / HPF TOO NUMEROUS TO COUNT 0 - 5 RBC/hpf   WBC, UA 0-5 0 - 5 WBC/hpf   Bacteria, UA FEW (A) NONE SEEN   Squamous Epithelial / LPF 0-5 (A) NONE SEEN   Mucous PRESENT   Pregnancy, urine POC     Status: None   Collection Time: 01/13/17  3:16 PM  Result Value Ref Range   Preg Test, Ur NEGATIVE NEGATIVE  CBC     Status: Abnormal   Collection Time: 01/13/17  3:32 PM  Result Value Ref Range   WBC 8.0 4.0 - 10.5 K/uL   RBC 3.29 (L) 3.87 - 5.11 MIL/uL   Hemoglobin 7.5 (L) 12.0 - 15.0 g/dL   HCT 23.1 (L) 36.0 - 46.0 %   MCV 70.2 (L) 78.0 - 100.0 fL   MCH 22.8 (L) 26.0 - 34.0 pg   MCHC 32.5 30.0 - 36.0 g/dL   RDW 21.1 (H) 11.5 -  15.5 %   Platelets 293 150 - 400 K/uL    --/--/O POS (01/26 1000)  IMAGING No results found.  MAU Management/MDM: Ordered labs and reviewed results.  Consult Dr Matthew Saras. Admit pt for blood transfusion and IV Premarin to stabilize endometrium. Discussed treatment and admission with pt who agrees with plan of care.  Pt stable at time of transfer to 3rd floor.  ASSESSMENT 1. Symptomatic anemia   2. Uterine leiomyoma, unspecified location  3. Abnormal uterine bleeding (AUB)     PLAN Admit to 3rd floor for blood transfusion and IV estrogen   Fatima Blank Certified Nurse-Midwife 01/13/2017  5:54 PM

## 2017-01-13 NOTE — MAU Note (Addendum)
Pt presents to MAU with complaints of heavy vaginal bleeding since January the 31st. Pt was evaluated in MAU on Monday and followed up with Dr Alfred Levins yesterday but she continues to have heavy bleeding. Scheduled for surgery to remove fibroids on February the 22nd Pt was started on Megace yesterday morning and has taken 3 doses and the bleeding continues to be heavy

## 2017-01-13 NOTE — H&P (Signed)
BREEA FETTY  DICTATION # [id number] CSN# [id number]   Margarette Asal, MD 01/13/2017 7:09 PM

## 2017-01-14 LAB — CBC
HCT: 27.2 % — ABNORMAL LOW (ref 36.0–46.0)
HEMOGLOBIN: 9.3 g/dL — AB (ref 12.0–15.0)
MCH: 25.3 pg — AB (ref 26.0–34.0)
MCHC: 34.2 g/dL (ref 30.0–36.0)
MCV: 74.1 fL — AB (ref 78.0–100.0)
Platelets: 207 10*3/uL (ref 150–400)
RBC: 3.67 MIL/uL — AB (ref 3.87–5.11)
RDW: 20 % — ABNORMAL HIGH (ref 11.5–15.5)
WBC: 7.2 10*3/uL (ref 4.0–10.5)

## 2017-01-14 LAB — TYPE AND SCREEN
BLOOD PRODUCT EXPIRATION DATE: 201803082359
Blood Product Expiration Date: 201803062359
ISSUE DATE / TIME: 201802072007
ISSUE DATE / TIME: 201802072257
UNIT TYPE AND RH: 5100
Unit Type and Rh: 5100

## 2017-01-14 MED ORDER — NORGESTIMATE-ETH ESTRADIOL 0.25-35 MG-MCG PO TABS: ORAL_TABLET | ORAL | 1 refills | Status: DC

## 2017-01-14 MED ORDER — NAPROXEN 500 MG PO TABS: 500.0000 mg | ORAL_TABLET | Freq: Two times a day (BID) | ORAL | 0 refills | Status: DC

## 2017-01-14 NOTE — Discharge Summary (Signed)
Physician Discharge Summary  Patient ID: Audrey Lang MRN: [id number] DOB/AGE: 1991-07-22 26 y.o.  Admit date: 01/13/2017 Discharge date: 01/14/2017  Admission Diagnoses:  Discharge Diagnoses:  Active Problems:   Leiomyoma   Discharged Condition: stable  Hospital Course: 26 yo G0 admitted for vaginal bleeding in the setting of a known likely necrosing fibroid. Patient is s/p 1u pRBC on 01/01/17 for symptomatic anemia and a hgb of <7. She was seen in the office, an SIS was done which showed a likely intracavitary fibroid. At that time she was not having any vaginal bleeding. Her bleeding began several days later - she was placed on a provera taper an d after 2 days she requested to switch to megace taper as that worked for her in the past. Strongly recommends OCP taper but pt worried it would increase fibroid size. Thus, megace given and pt continued to have heavy bleeding. Had s/s of anemia last night and came to hospital> Hgb 7 on presentation and heavy vaginal bleeding. IV premarin given with resolution of bleeding. 2u pRBC given with hgb increase to 9. Plan for outpatient OCP taper as pt now in agreement with this plan. Patient desires to not have surgery until her scheduled date of 2/22 s/s plans. Discussed risk of bleeding again and possible need for emergent surgery at anytime- pt aware. Plan to have pt come for CBC two days prior to surgery - if <8, admit overnight day before surgery for pRBC. Upon discharge pt is w/out any s/s of anemia (exam below). Will d/c with naproxen prn.   Consults: None  Significant Diagnostic Studies: labs:  CBC    Component Value Date/Time   WBC 7.2 01/14/2017 0606   RBC 3.67 (L) 01/14/2017 0606   HGB 9.3 (L) 01/14/2017 0606   HCT 27.2 (L) 01/14/2017 0606   PLT 207 01/14/2017 0606   MCV 74.1 (L) 01/14/2017 0606   MCH 25.3 (L) 01/14/2017 0606   MCHC 34.2 01/14/2017 0606   RDW 20.0 (H) 01/14/2017 0606   LYMPHSABS 3.1 01/11/2017 1934   MONOABS 0.3  01/11/2017 1934   EOSABS 0.1 01/11/2017 1934   BASOSABS 0.0 01/11/2017 1934    Treatments: 2u pRBC  Discharge Exam: Blood pressure 103/65, pulse 91, temperature 99.3 F (37.4 C), temperature source Oral, resp. rate 18, height 5' (1.524 m), weight 59.9 kg (132 lb), last menstrual period 01/06/2017, SpO2 100 %.  NAD, A&O NWOB Abd soft, nondistended, gravid GYN: min blood on pad - last changed 4 hours ago   Disposition: 01-Home or Self Care  Discharge Instructions    Call MD for:    Complete by:  As directed    Heavy vaginal bleeding or abnormal vaginal discharge   Call MD for:  difficulty breathing, headache or visual disturbances    Complete by:  As directed    Call MD for:  persistant nausea and vomiting    Complete by:  As directed    Call MD for:  severe uncontrolled pain    Complete by:  As directed    Call MD for:  temperature >100.4    Complete by:  As directed    Diet general    Complete by:  As directed    Driving Restrictions    Complete by:  As directed    Do not drive until you are off narcotic pain medications and you feel like you can react in an emergency.   Increase activity slowly    Complete by:  As directed  Sexual Activity Restrictions    Complete by:  As directed    Nothing in the vagina x 2 to 6 weeks. We will discuss at clinic visit.     Allergies as of 01/14/2017      Reactions   Banana Swelling   Lamictal [lamotrigine] Rash      Medication List    STOP taking these medications   medroxyPROGESTERone 10 MG tablet Commonly known as:  PROVERA   megestrol 40 MG tablet Commonly known as:  MEGACE   Vitamin C Chew     TAKE these medications   naproxen 500 MG tablet Commonly known as:  NAPROSYN Take 1 tablet (500 mg total) by mouth 2 (two) times daily with a meal.   norgestimate-ethinyl estradiol 0.25-35 MG-MCG tablet Commonly known as:  ORTHO-CYCLEN (28) Take 4 tablets tonight. 3 tablets x 3 days. 2 tablets x 2 days. 1 tablet once a  day until finishing the pack then start a new pack.   oxyCODONE-acetaminophen 5-325 MG tablet Commonly known as:  ROXICET Take 1 tablet by mouth every 8 (eight) hours. PRN for pain        Signed: Tyson Dense 01/14/2017, 10:48 AM

## 2017-01-14 NOTE — H&P (Signed)
HPI: 26 yo G0 here for blood transfusion. Seen in office - has known fibroid and plans for mmy soon. No vaginal bleeding currently. Hgb <7 in office, + s/s of anemia.   PMH: asthma, seziures, anemia  PSH: MMY in 2003, tonsilectomy  Allergies: bananas and lamictal  ROS: WNL except HPI  O: AFVSS Gen NAD Abd soft, nontender, nondistended GYN no bleeding Ext no edema b/l  A/P: 25 yo here w/symptomatic anemia for pRBC transfusion. No vaginal bleeding currently. Consented for blood- risks discussed including infection and transfusion reaction. Pt agrees to proceed. Plan for 1u pRBC.   Royston Sinner MD

## 2017-01-14 NOTE — Progress Notes (Signed)
Pt teaching complete  Ambulate  Out

## 2017-01-14 NOTE — Discharge Summary (Signed)
Patient received 1upRBC, tolerated it well, and was discharged home in stable condition. No complications. Has f/u in office in two days. No vaginal bleeding.   Arty Baumgartner MD

## 2017-01-14 NOTE — H&P (Signed)
NAMEKEOKA, TOMCZYK              ACCOUNT NO.:  [id number]  MEDICAL RECORD NO.:  [id number]  LOCATION:                                 FACILITY:  PHYSICIAN:  Ralene Bathe. Matthew Saras, M.D.DATE OF BIRTH:  [birth year]  DATE OF ADMISSION: DATE OF DISCHARGE:                             HISTORY & PHYSICAL   CHIEF COMPLAINT:  Uterine leiomyoma, heavy vaginal bleeding.  HISTORY OF PRESENT ILLNESS:  A 26 year old, G1, P0, currently using condoms for protection.  Has had a prior myomectomy in 2016.  She has been evaluated by Dr. Corinna Capra recently for significant menorrhagia with anemia and dysmenorrhea, was found to have a 3 cm submucosal fibroid and is actually scheduled for hysteroscopic resection.  She has had transfusion for severe anemia in the past and has been on Megace recently to try to regulate her bleeding until the time of surgery, but presents to MAU late today with increased bleeding and hemoglobin of 7.5.  She is admitted now overnight for transfusion and treatment with IV Premarin to try to stabilize her bleeding preoperatively.  ALLERGIES:  None.  CURRENT MEDICATIONS:  Megace.  PAST SURGICAL HISTORY:  She had a MyoSure attempted myomectomy in 2016.  FAMILY HISTORY:  Significant for hypertension and diabetes, otherwise negative.  PHYSICAL EXAMINATION:  VITAL SIGNS:  Temp 98.2, blood pressure 120/62. Her weight is 129. HEENT:  Unremarkable. NECK:  Supple without masses. LUNGS:  Clear. CARDIOVASCULAR:  Regular rate and rhythm without murmurs, rubs, or gallops noted. BREASTS:  Not examined. ABDOMEN:  Soft, flat, and nontender.  Uterus upper limit of normal size. Adnexa negative.  IMPRESSION:  Abnormal uterine bleeding secondary to submucous fibroid, scheduled for hysteroscopic resection, possible open myomectomy.  PLAN:  We will admit for IV Premarin treatment and transfusion.     Krishauna Schatzman M. Matthew Saras, M.D.   ______________________________ Ralene Bathe. Matthew Saras,  M.D.    [facility]/MEDQ  D:  01/13/2017  T:  01/13/2017  Job:  [id number]

## 2017-01-15 ENCOUNTER — Encounter (HOSPITAL_COMMUNITY): Payer: Self-pay

## 2017-01-19 NOTE — Patient Instructions (Signed)
Your procedure is scheduled on:  Thursday, Feb. 22, 2018  Enter through the [employer] [facility] at:  11:45 AM  Pick up the phone at the desk and dial [phone].  Call this number if you have problems the morning of surgery: [phone].  Remember: Do NOT eat food:  After 5:00 AM day of surgery  Do NOT drink clear liquids after:  9:00 AM day of surgery  Take these medicines the morning of surgery with a SIP OF WATER:  None  Stop ALL herbal medications and Vitamin C at this time  Do NOT smoke the day of surgery.  Do NOT wear jewelry (body piercing), metal hair clips/bobby pins, make-up, or nail polish. Do NOT wear lotions, powders, or perfumes.  You may wear deodorant. Do NOT shave for 48 hours prior to surgery. Do NOT bring valuables to the hospital. Contacts, dentures, or bridgework may not be worn into surgery.  Leave suitcase in car.  After surgery it may be brought to your room.  For patients admitted to the hospital, checkout time is 11:00 AM the day of discharge.  Bring a copy of your healthcare power of attorney and living will documents.  **Effective Friday, Jan. 12, 2018, [place] will implement no hospital visitations from children age 73 and younger due to a steady increase in flu activity in our community and hospitals. **

## 2017-01-20 ENCOUNTER — Encounter (HOSPITAL_COMMUNITY)
Admission: RE | Admit: 2017-01-20 | Discharge: 2017-01-20 | Disposition: A | Discharge status: Home / Self Care | Payer: BLUE CROSS/BLUE SHIELD | Source: Ambulatory Visit | Attending: Obstetrics and Gynecology | Admitting: Obstetrics and Gynecology

## 2017-01-20 ENCOUNTER — Encounter (HOSPITAL_COMMUNITY): Payer: Self-pay

## 2017-01-20 DIAGNOSIS — D62 Acute posthemorrhagic anemia: Secondary | ICD-10-CM | POA: Diagnosis not present

## 2017-01-20 HISTORY — DX: Adverse effect of unspecified anesthetic, initial encounter: T41.45XA

## 2017-01-20 HISTORY — DX: Dyspnea, unspecified: R06.00

## 2017-01-20 HISTORY — DX: Other complications of anesthesia, initial encounter: T88.59XA

## 2017-01-20 HISTORY — DX: Gastro-esophageal reflux disease without esophagitis: K21.9

## 2017-01-20 LAB — TYPE AND SCREEN
ABO/RH(D): O POS
Antibody Screen: NEGATIVE

## 2017-01-21 LAB — CBC
HEMATOCRIT: 32 % — AB (ref 36.0–46.0)
HEMOGLOBIN: 10.4 g/dL — AB (ref 12.0–15.0)
MCH: 24.9 pg — ABNORMAL LOW (ref 26.0–34.0)
MCHC: 32.5 g/dL (ref 30.0–36.0)
MCV: 76.6 fL — AB (ref 78.0–100.0)
Platelets: 474 10*3/uL — ABNORMAL HIGH (ref 150–400)
RBC: 4.18 MIL/uL (ref 3.87–5.11)
RDW: 21.4 % — ABNORMAL HIGH (ref 11.5–15.5)
WBC: 8.9 10*3/uL (ref 4.0–10.5)

## 2017-01-25 NOTE — H&P (Signed)
Audrey Lang is an 26 y.o. female. Presenting for scheduled surgery. 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 US showed 3cm fibroid, SIS with fibroid intracavitary. At the time she had no recent vaginal bleeding and hgb was 6.9, with symptomatic anemia. She thus was admitted for 1upRBC with a rise in hgb to 9 and a plan to do a [facility] mmy with possible open mmy if no fibroid noted in her cavity. Several days later she began to have heavy vaginal bleeding which was unresponsive to both provera and megace. I offered to perform her surgery earlier than this date but pt declined. She ultimately required admission for IV premarin and more blood. She was discharged in stable condition on an OCP taper and presents today for her scheduled surgery. Her most recent hgb was 10 in her pre-op labs.   Pertinent Gynecological History: Menses: flow is excessive with use of 12 pads or tampons on heaviest days Bleeding: intermenstrual bleeding and dysfunctional uterine bleeding Contraception: condoms DES exposure: denies Blood transfusions: twice Sexually transmitted diseases: no past history Previous GYN Procedures: prior [place]  Last mammogram: N/A Date: N/A Last pap: normal Date: 2017 OB History: G1, P0010   Menstrual History: Patient's last menstrual period was 01/06/2017.    Past Medical History:  Diagnosis Date  . Anemia   . Asthma   . Complication of anesthesia    had 3 seizures after anesthesia, severe skin rash  . Dyspnea    anemia  . Fibroid   . GERD (gastroesophageal reflux disease)   . History of blood transfusion    01/2017  . Seizures ([place]) 2009   last seizure 2011    Past Surgical History:  Procedure Laterality Date  . MYOMECTOMY     in [place], no fibroids removed  . TONSILLECTOMY  2003    Family History  Problem Relation Age of Onset  . Ulcers Mother   . Asthma Sister     Social History:   reports that she has never smoked. She has never used smokeless tobacco. She reports that she drinks about 0.5 oz of alcohol per week . She reports that she does not use drugs.  Allergies:  Allergies  Allergen Reactions  . Banana Swelling  . Diprivan [Propofol]     seizure  . Fentanyl     seizures  . Versed [Midazolam]     seizure  . Zofran [Ondansetron Hcl]     seizure  . Lamictal [Lamotrigine] Rash    No prescriptions prior to admission.    ROS  Last menstrual period 01/06/2017. Physical Exam Gen NAD, well appearing CV reg rate Pulm NWOB Abd soft, nontender, nondistended GYN 9 week size uterus, retroverted. No CMT/adnexal tenderness. Adnexa non-palpable.  No results found for this or any previous visit (from the past 24 hour(s)).  No results found.  Assessment/Plan: 26 yo here for scheduled [place] mmy, possible open mmy. She has a 3cm intracavitary fibroid visualized on [place] and SIS that is causing heavy AUB. Discussed possibility of requiring open mmy if [place] reveals no fibroid as this would likely mean fibroid is in uterine muscle. Risks discussed including infection, bleeding, need for blood transfusion, damage to surrounding structures, fluid overload, any additional procedures, and the possibility of an open myomectomy. Patient agrees to procedure knowing these risks. Plan for preop hgb to ensure >8. TO Or. No abx unless convert to open mmy.   Tyson Dense 01/25/2017,  3:23 PM

## 2017-01-28 ENCOUNTER — Observation Stay (HOSPITAL_COMMUNITY)
Admission: RE | Admit: 2017-01-28 | Discharge: 2017-01-31 | Disposition: A | Discharge status: Home / Self Care | Payer: BLUE CROSS/BLUE SHIELD | Source: Ambulatory Visit | Attending: Internal Medicine | Admitting: Internal Medicine

## 2017-01-28 ENCOUNTER — Ambulatory Visit (HOSPITAL_COMMUNITY): Payer: BLUE CROSS/BLUE SHIELD | Admitting: Anesthesiology

## 2017-01-28 ENCOUNTER — Ambulatory Visit (HOSPITAL_COMMUNITY): Payer: BLUE CROSS/BLUE SHIELD

## 2017-01-28 ENCOUNTER — Encounter (HOSPITAL_COMMUNITY): Payer: Self-pay | Admitting: Emergency Medicine

## 2017-01-28 ENCOUNTER — Encounter (HOSPITAL_COMMUNITY)
Admission: RE | Disposition: A | Discharge status: Home / Self Care | Payer: Self-pay | Source: Ambulatory Visit | Attending: Internal Medicine

## 2017-01-28 DIAGNOSIS — D649 Anemia, unspecified: Secondary | ICD-10-CM | POA: Diagnosis present

## 2017-01-28 DIAGNOSIS — Z91018 Allergy to other foods: Secondary | ICD-10-CM | POA: Diagnosis not present

## 2017-01-28 DIAGNOSIS — J45909 Unspecified asthma, uncomplicated: Secondary | ICD-10-CM | POA: Diagnosis not present

## 2017-01-28 DIAGNOSIS — K219 Gastro-esophageal reflux disease without esophagitis: Secondary | ICD-10-CM | POA: Diagnosis not present

## 2017-01-28 DIAGNOSIS — N99820 Postprocedural hemorrhage and hematoma of a genitourinary system organ or structure following a genitourinary system procedure: Secondary | ICD-10-CM | POA: Insufficient documentation

## 2017-01-28 DIAGNOSIS — R569 Unspecified convulsions: Secondary | ICD-10-CM | POA: Diagnosis not present

## 2017-01-28 DIAGNOSIS — R531 Weakness: Secondary | ICD-10-CM

## 2017-01-28 DIAGNOSIS — R740 Nonspecific elevation of levels of transaminase and lactic acid dehydrogenase [LDH]: Secondary | ICD-10-CM | POA: Insufficient documentation

## 2017-01-28 DIAGNOSIS — Z888 Allergy status to other drugs, medicaments and biological substances status: Secondary | ICD-10-CM | POA: Insufficient documentation

## 2017-01-28 DIAGNOSIS — R1030 Lower abdominal pain, unspecified: Secondary | ICD-10-CM | POA: Diagnosis not present

## 2017-01-28 DIAGNOSIS — R748 Abnormal levels of other serum enzymes: Secondary | ICD-10-CM | POA: Diagnosis not present

## 2017-01-28 DIAGNOSIS — N920 Excessive and frequent menstruation with regular cycle: Secondary | ICD-10-CM | POA: Diagnosis not present

## 2017-01-28 DIAGNOSIS — R58 Hemorrhage, not elsewhere classified: Secondary | ICD-10-CM

## 2017-01-28 DIAGNOSIS — Y838 Other surgical procedures as the cause of abnormal reaction of the patient, or of later complication, without mention of misadventure at the time of the procedure: Secondary | ICD-10-CM | POA: Diagnosis not present

## 2017-01-28 DIAGNOSIS — D250 Submucous leiomyoma of uterus: Secondary | ICD-10-CM | POA: Diagnosis not present

## 2017-01-28 DIAGNOSIS — Z885 Allergy status to narcotic agent status: Secondary | ICD-10-CM | POA: Diagnosis not present

## 2017-01-28 DIAGNOSIS — Z9889 Other specified postprocedural states: Secondary | ICD-10-CM

## 2017-01-28 DIAGNOSIS — D509 Iron deficiency anemia, unspecified: Secondary | ICD-10-CM | POA: Diagnosis not present

## 2017-01-28 DIAGNOSIS — R7401 Elevation of levels of liver transaminase levels: Secondary | ICD-10-CM

## 2017-01-28 DIAGNOSIS — N938 Other specified abnormal uterine and vaginal bleeding: Secondary | ICD-10-CM | POA: Diagnosis not present

## 2017-01-28 DIAGNOSIS — R109 Unspecified abdominal pain: Secondary | ICD-10-CM

## 2017-01-28 HISTORY — DX: Personal history of other medical treatment: Z92.89

## 2017-01-28 HISTORY — PX: DILATATION & CURRETTAGE/HYSTEROSCOPY WITH RESECTOCOPE: SHX5572

## 2017-01-28 HISTORY — PX: HYSTEROSCOPY: SHX211

## 2017-01-28 HISTORY — PX: DILATION AND EVACUATION: SHX1459

## 2017-01-28 LAB — COMPREHENSIVE METABOLIC PANEL
ALT: 96 U/L — ABNORMAL HIGH (ref 14–54)
ANION GAP: 6 (ref 5–15)
AST: 48 U/L — ABNORMAL HIGH (ref 15–41)
Albumin: 3.6 g/dL (ref 3.5–5.0)
Alkaline Phosphatase: 37 U/L — ABNORMAL LOW (ref 38–126)
BILIRUBIN TOTAL: 0.5 mg/dL (ref 0.3–1.2)
CHLORIDE: 108 mmol/L (ref 101–111)
CO2: 23 mmol/L (ref 22–32)
Calcium: 8.1 mg/dL — ABNORMAL LOW (ref 8.9–10.3)
Creatinine, Ser: 0.59 mg/dL (ref 0.44–1.00)
Glucose, Bld: 111 mg/dL — ABNORMAL HIGH (ref 65–99)
POTASSIUM: 3.8 mmol/L (ref 3.5–5.1)
Sodium: 137 mmol/L (ref 135–145)
TOTAL PROTEIN: 6.1 g/dL — AB (ref 6.5–8.1)

## 2017-01-28 LAB — CBC
HCT: 31.9 % — ABNORMAL LOW (ref 36.0–46.0)
HCT: 27.5 % — ABNORMAL LOW (ref 36.0–46.0)
HEMOGLOBIN: 10.7 g/dL — AB (ref 12.0–15.0)
Hemoglobin: 9.3 g/dL — ABNORMAL LOW (ref 12.0–15.0)
MCH: 24.7 pg — AB (ref 26.0–34.0)
MCH: 25.1 pg — AB (ref 26.0–34.0)
MCHC: 33.5 g/dL (ref 30.0–36.0)
MCHC: 33.8 g/dL (ref 30.0–36.0)
MCV: 73.5 fL — ABNORMAL LOW (ref 78.0–100.0)
MCV: 74.3 fL — ABNORMAL LOW (ref 78.0–100.0)
PLATELETS: 612 10*3/uL — AB (ref 150–400)
Platelets: 539 10*3/uL — ABNORMAL HIGH (ref 150–400)
RBC: 4.34 MIL/uL (ref 3.87–5.11)
RBC: 3.7 MIL/uL — AB (ref 3.87–5.11)
RDW: 19.8 % — ABNORMAL HIGH (ref 11.5–15.5)
RDW: 19.9 % — ABNORMAL HIGH (ref 11.5–15.5)
WBC: 6.6 10*3/uL (ref 4.0–10.5)
WBC: 12.1 10*3/uL — ABNORMAL HIGH (ref 4.0–10.5)

## 2017-01-28 LAB — DIC (DISSEMINATED INTRAVASCULAR COAGULATION) PANEL
D DIMER QUANT: 1.41 ug{FEU}/mL — AB (ref 0.00–0.50)
FIBRINOGEN: 244 mg/dL (ref 210–475)
PLATELETS: 528 10*3/uL — AB (ref 150–400)
PROTHROMBIN TIME: 14 s (ref 11.4–15.2)
SMEAR REVIEW: NONE SEEN

## 2017-01-28 LAB — DIC (DISSEMINATED INTRAVASCULAR COAGULATION)PANEL
INR: 1.07
aPTT: 28 seconds (ref 24–36)

## 2017-01-28 LAB — TYPE AND SCREEN
ABO/RH(D): O POS
ANTIBODY SCREEN: NEGATIVE

## 2017-01-28 LAB — PREGNANCY, URINE: Preg Test, Ur: NEGATIVE

## 2017-01-28 SURGERY — HYSTEROSCOPY
Anesthesia: General

## 2017-01-28 SURGERY — DILATATION & CURETTAGE/HYSTEROSCOPY WITH RESECTOCOPE
Anesthesia: General | Site: Uterus

## 2017-01-28 MED ORDER — PANTOPRAZOLE SODIUM 40 MG PO TBEC
40.0000 mg | DELAYED_RELEASE_TABLET | Freq: Every day | ORAL | Status: DC
  Administered 2017-01-28 – 2017-01-31 (×4): 40 mg via ORAL
  Filled 2017-01-28 (×4): qty 1

## 2017-01-28 MED ORDER — MIDAZOLAM HCL 2 MG/2ML IJ SOLN
INTRAMUSCULAR | Status: AC
  Filled 2017-01-28: qty 2

## 2017-01-28 MED ORDER — TRAMADOL HCL 50 MG PO TABS
50.0000 mg | ORAL_TABLET | Freq: Four times a day (QID) | ORAL | Status: DC | PRN
  Administered 2017-01-29 – 2017-01-30 (×2): 50 mg via ORAL
  Filled 2017-01-28 (×2): qty 1

## 2017-01-28 MED ORDER — CEFAZOLIN SODIUM-DEXTROSE 2-4 GM/100ML-% IV SOLN
2.0000 g | INTRAVENOUS | Status: AC
  Administered 2017-01-28: 2 g via INTRAVENOUS

## 2017-01-28 MED ORDER — ROCURONIUM BROMIDE 100 MG/10ML IV SOLN
INTRAVENOUS | Status: DC | PRN
  Administered 2017-01-28: 35 mg via INTRAVENOUS

## 2017-01-28 MED ORDER — LACTATED RINGERS IV SOLN
INTRAVENOUS | Status: DC
  Administered 2017-01-28 (×2): via INTRAVENOUS

## 2017-01-28 MED ORDER — ONDANSETRON HCL 4 MG/2ML IJ SOLN
INTRAMUSCULAR | Status: AC
  Filled 2017-01-28: qty 2

## 2017-01-28 MED ORDER — CEFAZOLIN SODIUM-DEXTROSE 2-4 GM/100ML-% IV SOLN
INTRAVENOUS | Status: AC
  Filled 2017-01-28: qty 100

## 2017-01-28 MED ORDER — LORAZEPAM 2 MG/ML IJ SOLN: 5.0000 mg | Freq: Once | INTRAMUSCULAR | Status: DC

## 2017-01-28 MED ORDER — ROCURONIUM BROMIDE 100 MG/10ML IV SOLN
INTRAVENOUS | Status: AC
  Filled 2017-01-28: qty 1

## 2017-01-28 MED ORDER — DEXAMETHASONE SODIUM PHOSPHATE 4 MG/ML IJ SOLN
INTRAMUSCULAR | Status: AC
  Filled 2017-01-28: qty 1

## 2017-01-28 MED ORDER — SODIUM CHLORIDE 0.9 % IJ SOLN
INTRAMUSCULAR | Status: AC
  Filled 2017-01-28: qty 10

## 2017-01-28 MED ORDER — HYDROMORPHONE HCL 1 MG/ML IJ SOLN: 0.5000 mg | INTRAMUSCULAR | Status: DC | PRN

## 2017-01-28 MED ORDER — DOCUSATE SODIUM 100 MG PO CAPS
100.0000 mg | ORAL_CAPSULE | Freq: Two times a day (BID) | ORAL | Status: DC
  Filled 2017-01-28 (×4): qty 1

## 2017-01-28 MED ORDER — LORAZEPAM 2 MG/ML IJ SOLN
1.0000 mg | Freq: Once | INTRAMUSCULAR | Status: AC
Start: 2017-01-28 — End: 2017-01-28
  Administered 2017-01-28: 1 mg via INTRAVENOUS

## 2017-01-28 MED ORDER — PROPOFOL 10 MG/ML IV BOLUS
INTRAVENOUS | Status: DC | PRN
  Administered 2017-01-28: 200 mg via INTRAVENOUS

## 2017-01-28 MED ORDER — LACTATED RINGERS IV SOLN: INTRAVENOUS | Status: DC

## 2017-01-28 MED ORDER — DIPHENHYDRAMINE HCL 50 MG/ML IJ SOLN
INTRAMUSCULAR | Status: AC
  Filled 2017-01-28: qty 1

## 2017-01-28 MED ORDER — LIDOCAINE HCL (CARDIAC) 20 MG/ML IV SOLN
INTRAVENOUS | Status: DC | PRN
  Administered 2017-01-28: 70 mg via INTRAVENOUS
  Administered 2017-01-28: 30 mg via INTRAVENOUS

## 2017-01-28 MED ORDER — CEFAZOLIN IN D5W 1 GM/50ML IV SOLN
1.0000 g | Freq: Three times a day (TID) | INTRAVENOUS | Status: DC
  Administered 2017-01-29: 1 g via INTRAVENOUS
  Filled 2017-01-28 (×2): qty 50

## 2017-01-28 MED ORDER — DIPHENHYDRAMINE HCL 50 MG/ML IJ SOLN
INTRAMUSCULAR | Status: DC | PRN
Start: 2017-01-28 — End: 2017-01-28
  Administered 2017-01-28 (×2): 12.5 mg via INTRAVENOUS

## 2017-01-28 MED ORDER — KETOROLAC TROMETHAMINE 30 MG/ML IJ SOLN
INTRAMUSCULAR | Status: AC
  Filled 2017-01-28: qty 1

## 2017-01-28 MED ORDER — ACETAMINOPHEN 325 MG PO TABS: 650.0000 mg | ORAL_TABLET | ORAL | Status: DC | PRN

## 2017-01-28 MED ORDER — LIDOCAINE HCL 1 % IJ SOLN
INTRAMUSCULAR | Status: AC
  Filled 2017-01-28: qty 20

## 2017-01-28 MED ORDER — ETOMIDATE 2 MG/ML IV SOLN
INTRAVENOUS | Status: AC
  Filled 2017-01-28: qty 10

## 2017-01-28 MED ORDER — CEFAZOLIN SODIUM-DEXTROSE 2-3 GM-% IV SOLR
INTRAVENOUS | Status: DC | PRN
  Administered 2017-01-28: 2 g via INTRAVENOUS

## 2017-01-28 MED ORDER — SODIUM CHLORIDE 0.9 % IV SOLN
1500.0000 mg | Freq: Once | INTRAVENOUS | Status: AC
  Administered 2017-01-28: 1500 mg via INTRAVENOUS
  Filled 2017-01-28: qty 15

## 2017-01-28 MED ORDER — KETOROLAC TROMETHAMINE 30 MG/ML IJ SOLN
INTRAMUSCULAR | Status: DC | PRN
  Administered 2017-01-28: 30 mg via INTRAVENOUS

## 2017-01-28 MED ORDER — HYDROMORPHONE HCL 1 MG/ML IJ SOLN
0.2000 mg | INTRAMUSCULAR | Status: DC | PRN
  Administered 2017-01-28: 0.5 mg via INTRAVENOUS
  Administered 2017-01-29: 0.2 mg via INTRAVENOUS
  Filled 2017-01-28 (×2): qty 1

## 2017-01-28 MED ORDER — SCOPOLAMINE 1 MG/3DAYS TD PT72
MEDICATED_PATCH | TRANSDERMAL | Status: AC
Start: 2017-01-28 — End: 2017-01-31
  Administered 2017-01-28: 1.5 mg via TRANSDERMAL
  Filled 2017-01-28: qty 1

## 2017-01-28 MED ORDER — SUGAMMADEX SODIUM 200 MG/2ML IV SOLN
INTRAVENOUS | Status: DC | PRN
  Administered 2017-01-28: 120 mg via INTRAVENOUS

## 2017-01-28 MED ORDER — LIDOCAINE HCL (CARDIAC) 20 MG/ML IV SOLN
INTRAVENOUS | Status: AC
  Filled 2017-01-28: qty 5

## 2017-01-28 MED ORDER — SENNOSIDES-DOCUSATE SODIUM 8.6-50 MG PO TABS: 1.0000 | ORAL_TABLET | Freq: Every evening | ORAL | Status: DC | PRN

## 2017-01-28 MED ORDER — LORAZEPAM 2 MG/ML IJ SOLN: 1.0000 mg | Freq: Once | INTRAMUSCULAR | Status: DC

## 2017-01-28 MED ORDER — SODIUM CHLORIDE 0.9 % IR SOLN
Status: DC | PRN
  Administered 2017-01-28: 3000 mL

## 2017-01-28 MED ORDER — IBUPROFEN 200 MG PO TABS
600.0000 mg | ORAL_TABLET | Freq: Four times a day (QID) | ORAL | Status: DC | PRN
  Administered 2017-01-29: 600 mg via ORAL
  Filled 2017-01-28: qty 1

## 2017-01-28 MED ORDER — DEXAMETHASONE SODIUM PHOSPHATE 10 MG/ML IJ SOLN
INTRAMUSCULAR | Status: DC | PRN
  Administered 2017-01-28: 4 mg via INTRAVENOUS

## 2017-01-28 MED ORDER — SODIUM CHLORIDE 0.9 % IR SOLN
Status: DC | PRN
  Administered 2017-01-28 (×3): 3000 mL

## 2017-01-28 MED ORDER — MIDAZOLAM HCL 2 MG/2ML IJ SOLN
INTRAMUSCULAR | Status: DC | PRN
  Administered 2017-01-28: 2 mg via INTRAVENOUS

## 2017-01-28 MED ORDER — DIAZEPAM 5 MG/ML IJ SOLN
5.0000 mg | Freq: Once | INTRAMUSCULAR | Status: AC
  Administered 2017-01-28: 5 mg via INTRAVENOUS

## 2017-01-28 MED ORDER — SIMETHICONE 80 MG PO CHEW: 80.0000 mg | CHEWABLE_TABLET | Freq: Four times a day (QID) | ORAL | Status: DC | PRN

## 2017-01-28 MED ORDER — FENTANYL CITRATE (PF) 100 MCG/2ML IJ SOLN
INTRAMUSCULAR | Status: AC
  Filled 2017-01-28: qty 2

## 2017-01-28 MED ORDER — OXYCODONE-ACETAMINOPHEN 5-325 MG PO TABS
1.0000 | ORAL_TABLET | ORAL | Status: DC | PRN
  Administered 2017-01-28 – 2017-01-29 (×2): 2 via ORAL
  Administered 2017-01-29: 1 via ORAL
  Filled 2017-01-28: qty 2
  Filled 2017-01-28 (×3): qty 1

## 2017-01-28 MED ORDER — SCOPOLAMINE 1 MG/3DAYS TD PT72
1.0000 | MEDICATED_PATCH | Freq: Once | TRANSDERMAL | Status: DC
  Administered 2017-01-28: 1.5 mg via TRANSDERMAL

## 2017-01-28 MED ORDER — HYDROMORPHONE HCL 1 MG/ML IJ SOLN
INTRAMUSCULAR | Status: AC
  Filled 2017-01-28: qty 1

## 2017-01-28 MED ORDER — HYDROMORPHONE HCL 1 MG/ML IJ SOLN
INTRAMUSCULAR | Status: DC | PRN
Start: 2017-01-28 — End: 2017-01-28
  Administered 2017-01-28 (×2): 0.5 mg via INTRAVENOUS

## 2017-01-28 MED ORDER — PROPOFOL 10 MG/ML IV BOLUS
INTRAVENOUS | Status: AC
  Filled 2017-01-28: qty 20

## 2017-01-28 MED ORDER — METOCLOPRAMIDE HCL 5 MG/ML IJ SOLN: 10.0000 mg | Freq: Once | INTRAMUSCULAR | Status: DC | PRN

## 2017-01-28 MED ORDER — DEXAMETHASONE SODIUM PHOSPHATE 10 MG/ML IJ SOLN: INTRAMUSCULAR | Status: DC | PRN

## 2017-01-28 SURGICAL SUPPLY — 48 items
BIPOLAR CUTTING LOOP 21FR (ELECTRODE)
CANISTER SUCT 3000ML (MISCELLANEOUS) IMPLANT
CANISTER SUCTION 2500CC (MISCELLANEOUS) ×4 IMPLANT
CATH ROBINSON RED A/P 16FR (CATHETERS) ×4 IMPLANT
CLOTH BEACON ORANGE TIMEOUT ST (SAFETY) ×4 IMPLANT
DECANTER SPIKE VIAL GLASS SM (MISCELLANEOUS) ×4 IMPLANT
DERMABOND ADVANCED (GAUZE/BANDAGES/DRESSINGS)
DERMABOND ADVANCED .7 DNX12 (GAUZE/BANDAGES/DRESSINGS) IMPLANT
DILATOR CANAL MILEX (MISCELLANEOUS) IMPLANT
DRAPE WARM FLUID 44X44 (DRAPE) IMPLANT
DRSG OPSITE POSTOP 4X10 (GAUZE/BANDAGES/DRESSINGS) ×4 IMPLANT
DURAPREP 26ML APPLICATOR (WOUND CARE) ×4 IMPLANT
ELECT REM PT RETURN 9FT ADLT (ELECTROSURGICAL) ×4
ELECTRODE REM PT RTRN 9FT ADLT (ELECTROSURGICAL) ×2 IMPLANT
GAUZE SPONGE 4X4 16PLY XRAY LF (GAUZE/BANDAGES/DRESSINGS) IMPLANT
GLOVE BIO SURGEON STRL SZ 6.5 (GLOVE) ×3 IMPLANT
GLOVE BIO SURGEONS STRL SZ 6.5 (GLOVE) ×1
GLOVE BIOGEL PI IND STRL 6.5 (GLOVE) ×2 IMPLANT
GLOVE BIOGEL PI IND STRL 7.0 (GLOVE) ×4 IMPLANT
GLOVE BIOGEL PI INDICATOR 6.5 (GLOVE) ×2
GLOVE BIOGEL PI INDICATOR 7.0 (GLOVE) ×4
GOWN SPEC L3 MED W/TWL (GOWN DISPOSABLE) ×8 IMPLANT
GOWN STRL REUS W/TWL LRG LVL3 (GOWN DISPOSABLE) ×12 IMPLANT
LOOP CUTTING BIPOLAR 21FR (ELECTRODE) IMPLANT
MYOSURE XL FIBROID REM (MISCELLANEOUS) ×4
NEEDLE HYPO 22GX1.5 SAFETY (NEEDLE) IMPLANT
NS IRRIG 1000ML POUR BTL (IV SOLUTION) ×4 IMPLANT
PACK ABDOMINAL GYN (CUSTOM PROCEDURE TRAY) ×4 IMPLANT
PACK VAGINAL MINOR WOMEN LF (CUSTOM PROCEDURE TRAY) ×4 IMPLANT
PAD OB MATERNITY 4.3X12.25 (PERSONAL CARE ITEMS) ×8 IMPLANT
PENCIL SMOKE EVAC W/HOLSTER (ELECTROSURGICAL) IMPLANT
PROTECTOR NERVE ULNAR (MISCELLANEOUS) IMPLANT
SPONGE LAP 18X18 X RAY DECT (DISPOSABLE) IMPLANT
SUT PLAIN 2 0 (SUTURE)
SUT PLAIN ABS 2-0 CT1 27XMFL (SUTURE) IMPLANT
SUT VIC AB 0 CT1 18XCR BRD8 (SUTURE) IMPLANT
SUT VIC AB 0 CT1 36 (SUTURE) IMPLANT
SUT VIC AB 0 CT1 8-18 (SUTURE)
SUT VIC AB 0 CTB1 36 (SUTURE) IMPLANT
SUT VIC AB 4-0 PS2 27 (SUTURE) IMPLANT
SUT VICRYL 0 TIES 12 18 (SUTURE) IMPLANT
SYRINGE 10CC LL (SYRINGE) ×4 IMPLANT
SYSTEM TISS REMOVAL MYSR XL RM (MISCELLANEOUS) ×2 IMPLANT
TOWEL OR 17X24 6PK STRL BLUE (TOWEL DISPOSABLE) ×8 IMPLANT
TRAY FOLEY CATH SILVER 14FR (SET/KITS/TRAYS/PACK) IMPLANT
TUBING AQUILEX INFLOW (TUBING) ×4 IMPLANT
TUBING AQUILEX OUTFLOW (TUBING) ×4 IMPLANT
WATER STERILE IRR 1000ML POUR (IV SOLUTION) ×4 IMPLANT

## 2017-01-28 SURGICAL SUPPLY — 36 items
BALLN POSTPARTUM SOS BAKRI (BALLOONS) ×3
BALLOON POSTPARTUM SOS BAKRI (BALLOONS) ×1 IMPLANT
BIPOLAR CUTTING LOOP 21FR (ELECTRODE) ×2
CANISTER SUCT 3000ML (MISCELLANEOUS) ×3 IMPLANT
CANISTER SUCTION 2500CC (MISCELLANEOUS) ×3 IMPLANT
CATH FOLEY 2WAY SLVR 30CC 16FR (CATHETERS) ×3 IMPLANT
CATH ROBINSON RED A/P 16FR (CATHETERS) ×6 IMPLANT
CLOTH BEACON ORANGE TIMEOUT ST (SAFETY) ×3 IMPLANT
DECANTER SPIKE VIAL GLASS SM (MISCELLANEOUS) ×3 IMPLANT
DILATOR CANAL MILEX (MISCELLANEOUS) IMPLANT
ELECT REM PT RETURN 9FT ADLT (ELECTROSURGICAL) ×3
ELECTRODE REM PT RTRN 9FT ADLT (ELECTROSURGICAL) ×1 IMPLANT
GLOVE BIO SURGEON STRL SZ 6.5 (GLOVE) ×2 IMPLANT
GLOVE BIO SURGEONS STRL SZ 6.5 (GLOVE) ×1
GLOVE BIOGEL PI IND STRL 6.5 (GLOVE) ×1 IMPLANT
GLOVE BIOGEL PI IND STRL 7.0 (GLOVE) ×2 IMPLANT
GLOVE BIOGEL PI INDICATOR 6.5 (GLOVE) ×2
GLOVE BIOGEL PI INDICATOR 7.0 (GLOVE) ×4
GOWN SPEC L3 MED W/TWL (GOWN DISPOSABLE) ×6 IMPLANT
GOWN STRL REUS W/TWL LRG LVL3 (GOWN DISPOSABLE) ×6 IMPLANT
KIT BERKELEY 1ST TRIMESTER 3/8 (MISCELLANEOUS) ×3 IMPLANT
LOOP CUTTING BIPOLAR 21FR (ELECTRODE) ×1 IMPLANT
NS IRRIG 1000ML POUR BTL (IV SOLUTION) ×3 IMPLANT
PACK VAGINAL MINOR WOMEN LF (CUSTOM PROCEDURE TRAY) ×3 IMPLANT
PAD OB MATERNITY 4.3X12.25 (PERSONAL CARE ITEMS) ×3 IMPLANT
PAD PREP 24X48 CUFFED NSTRL (MISCELLANEOUS) ×3 IMPLANT
SET BERKELEY SUCTION TUBING (SUCTIONS) ×3 IMPLANT
SYRINGE 60CC LL (MISCELLANEOUS) ×3 IMPLANT
TOWEL OR 17X24 6PK STRL BLUE (TOWEL DISPOSABLE) ×6 IMPLANT
TRAY FOLEY BAG SILVER LF 14FR (CATHETERS) ×3 IMPLANT
TUBING AQUILEX INFLOW (TUBING) ×6 IMPLANT
TUBING AQUILEX OUTFLOW (TUBING) ×6 IMPLANT
VACURETTE 10 RIGID CVD (CANNULA) IMPLANT
VACURETTE 7MM CVD STRL WRAP (CANNULA) IMPLANT
VACURETTE 8 RIGID CVD (CANNULA) ×3 IMPLANT
VACURETTE 9 RIGID CVD (CANNULA) IMPLANT

## 2017-01-28 NOTE — Transfer of Care (Signed)
Immediate Anesthesia Transfer of Care Note  Patient: Audrey Lang  Procedure(s) Performed: Procedure(s): HYSTEROSCOPY WITH INSERTION OF FOLEY CATHETER BALLOON IN UTERUS (N/A) ULTRASOUND GUIDED DILATATION AND EVACUATION (N/A)  Patient Location: PACU  Anesthesia Type:General  Level of Consciousness: sedated  Airway & Oxygen Therapy: Patient Spontanous Breathing and Patient connected to face mask oxygen  Post-op Assessment: Report given to RN  Post vital signs: Reviewed and stable  Last Vitals:  Vitals:   01/28/17 1523 01/28/17 1530  BP:    Pulse: 89 91  Resp: 17 (!) 23  Temp:      Last Pain:  Vitals:   01/28/17 1212  TempSrc: Oral  PainSc: 2       Patients Stated Pain Goal: 5 ([id number])  Complications: No apparent anesthesia complications

## 2017-01-28 NOTE — OR Nursing (Signed)
Pt was on her cell phone during the entire pre op process.  She was texting or on facetime.

## 2017-01-28 NOTE — Progress Notes (Signed)
CTBS for RN report of pt saturating two peripads in one hour. Arrived within minutes. Upon arrival, pt having a seizure and anethesia at bedside pushing ativan 5mg . Seizures lasted ~ 20 seconds, grand mal, post-ictal x several minutes. Of note, pt has h/o seizure d/o. Most recent seizure was s/p last MMY in 2016 where she had grand mal seizures s/s anesthesia per the patient report. After seizure, examined patient with speculum. Tenaculum sites noted to be hemostatic but light bleeding from cervical os with valsalva. BSUS performed and ~4cm of clot noted inside of the uterus. Decision made to bring her back to OR for a more extensive exam and evacuation of intrauterine clot. Intrauterine catheter placed to tamponade mmy site while en route to Or. Patient had several other seizures while preparing her for transfer to the Or. She remained in a Post-ictal state. She received a total of 10mg  of ativan. Neurology c/s pending. 2g ancef on call to Or. She was HDS through-out entire evaluation - albeit in post-ictal state. BP remained stable through-out evaluation. During seizure, mild tachycardia but HR normal in post-ictal state. She never had any desaturations and was always able to protect her airway.

## 2017-01-28 NOTE — Anesthesia Postprocedure Evaluation (Addendum)
Anesthesia Post Note  Patient: Audrey Lang  Procedure(s) Performed: Procedure(s) (LRB): HYSTEROSCOPY WITH INSERTION OF FOLEY CATHETER BALLOON IN UTERUS (N/A) ULTRASOUND GUIDED DILATATION AND EVACUATION (N/A)  Patient location during evaluation: PACU Anesthesia Type: General Level of consciousness: confused, sedated and awake Pain management: satisfactory to patient Vital Signs Assessment: post-procedure vital signs reviewed and stable Respiratory status: spontaneous breathing, nonlabored ventilation, respiratory function stable and patient connected to nasal cannula oxygen Cardiovascular status: blood pressure returned to baseline and stable Postop Assessment: no signs of nausea or vomiting Anesthetic complications: no Comments: Still postictal Will need admission and consult for seizures        Last Vitals:  Vitals:   01/28/17 1715 01/28/17 1730  BP: 111/69 120/70  Pulse: 88 80  Resp: 17 15  Temp:  37.3 C    Last Pain:  Vitals:   01/28/17 1212  TempSrc: Oral  PainSc: 2    Pain Goal: Patients Stated Pain Goal: 5 (01/28/17 1212)               Montez Hageman

## 2017-01-28 NOTE — Op Note (Signed)
PREOPERATIVE DIAGNOSES: 1. Submucosal fibroid   POSTOPERATIVE DIAGNOSES: Same  PROCEDURE PERFORMED: Dilation, hysteroscopy, myomectomy with myosure  SURGEON: Dr. Lucillie Garfinkel  ANESTHESIA: general  ESTIMATED BLOOD LOSS: 50cc.  FLUID DEFICIT: 2000cc (likely less - [id number] on floor)  COMPLICATIONS: None  TUBES: None.  DRAINS: None  PATHOLOGY: fibroid  FINDINGS: On exam, under anesthesia, normal appearing vulva and vagina, 9 week sized uterus  Operative findings demonstrated large intracavitary fibroid, B/l ostia visualized  Procedure: The patient was taken to the operating room where she was properly prepped and draped in sterile manner under general anesthesia. After bimanual examination, the cervix was exposed with a vaginal speculum and the anterior lip of the cervix grasped with a tenaculum.  The endocervical canal was then progressively dilated to 50mm. The hysteroscope was then introduced into the uterine cavity using sterile saline solution as a distending media and with attached video camera. The endometrial cavity was distended with fluids and the cavity with the b/l ostia were visualized. A large intracavitary fibroid was noted. Myosure inserted and fibroid easily resected with myosure device. Photos taken before and after. Fibroid bed noted to be completely hemostatic. Hysteroscope removed. Sharp curretage was then performed. No bleeding noted and gritty texture noted. Tenaculum site with oozing - silver nitrate applied. Good hemostasis. All instruments removed from vagina. No bleeding from os or from tenaculaum site at end of case. The sponge and lap counts were correct times 2 at this time. The patient's procedure was terminated. We then awakened her. She was sent to the Recovery Room in good condition.    Lucillie Garfinkel MD

## 2017-01-28 NOTE — Progress Notes (Signed)
Pt with body wide shaking Dr Thomes Cake at bedside to witness. Orders received. Awaiting Dr Elaina Pattee arrival.  OR notified and gathering requested supplies to bedside.

## 2017-01-28 NOTE — Anesthesia Postprocedure Evaluation (Addendum)
Anesthesia Post Note  Patient: Audrey Lang  Procedure(s) Performed: Procedure(s) (LRB): [place] (N/A)  Patient location during evaluation: PACU Anesthesia Type: General Level of consciousness: awake and confused (post ictal) Pain management: pain level not controlled Vital Signs Assessment: post-procedure vital signs reviewed and stable Respiratory status: spontaneous breathing, nonlabored ventilation, respiratory function stable and non-rebreather facemask Cardiovascular status: blood pressure returned to baseline and stable Postop Assessment: no signs of nausea or vomiting Anesthetic complications: no Comments: Multiple tonic clonic witnessed seizues in PACU despite avoiding medications that may have caused a similar episode during her last surgery. Given IV Valium and Ativan. Persistent vaginal bleeding. Unable to tolerate vaginal exam in PACU. Back to OR emergently.        Last Vitals:  Vitals:   01/28/17 1715 01/28/17 1730  BP: 111/69 120/70  Pulse: 88 80  Resp: 17 15  Temp:  37.3 C    Last Pain:  Vitals:   01/28/17 1212  TempSrc: Oral  PainSc: 2    Pain Goal: Patients Stated Pain Goal: 5 (01/28/17 1212)               Montez Hageman

## 2017-01-28 NOTE — Progress Notes (Signed)
Pt to OR.

## 2017-01-28 NOTE — Anesthesia Procedure Notes (Signed)
Procedure Name: Intubation Date/Time: 01/28/2017 1:14 PM Performed by: Tobin Chad Pre-anesthesia Checklist: Patient identified, Emergency Drugs available, Patient being monitored and Suction available Patient Re-evaluated:Patient Re-evaluated prior to inductionOxygen Delivery Method: Simple face mask and Circle system utilized Preoxygenation: Pre-oxygenation with 100% oxygen Intubation Type: IV induction and Inhalational induction Ventilation: Mask ventilation without difficulty Laryngoscope Size: Mac and 3 Grade View: Grade II Tube type: Oral Tube size: 7.0 mm Airway Equipment and Method: Stylet Placement Confirmation: ETT inserted through vocal cords under direct vision,  positive ETCO2 and breath sounds checked- equal and bilateral Secured at: 21 cm Tube secured with: Tape Dental Injury: Teeth and Oropharynx as per pre-operative assessment

## 2017-01-28 NOTE — Progress Notes (Signed)
Ultrasound here to assist Dr Royston Sinner and OR team to assess bleeding.

## 2017-01-28 NOTE — Anesthesia Procedure Notes (Signed)
Procedure Name: LMA Insertion Date/Time: 01/28/2017 3:49 PM Performed by: Casimer Lanius A Pre-anesthesia Checklist: Patient being monitored, Patient identified, Emergency Drugs available and Suction available Patient Re-evaluated:Patient Re-evaluated prior to inductionOxygen Delivery Method: Circle system utilized Preoxygenation: Pre-oxygenation with 100% oxygen Intubation Type: IV induction Ventilation: Mask ventilation without difficulty LMA: LMA with gastric port inserted LMA Size: 4.0 Number of attempts: 1 Dental Injury: Teeth and Oropharynx as per pre-operative assessment

## 2017-01-28 NOTE — Progress Notes (Signed)
Dr Royston Sinner Notified

## 2017-01-28 NOTE — Consult Note (Signed)
Admission H&P    Chief Complaint: Generalized seizure.  HPI: Audrey Lang is an 26 y.o. female with a history of seizures following head trauma in 2009, as well as seizure activity associated with anesthesia. She reportedly was treated with anticonvulsant medication for several years, which was subsequently discontinued. She reportedly sees a neurologist in [place] every 6 months for continued certification for driving safety. She had a witnessed generalized seizure in September 2016 associated with anesthesia and surgical procedure. She was not restarted on medication, as this was thought to be an induced seizure. She underwent D&C/hysteroscopy with myosure earlier today with general anesthesia and had witnessed seizure activity postoperatively. Because of a prior history of seizure activity a loading dose of Keppra 1500 mg was recommended, as patient had to be taken back to the OR for management of persistent postop bleeding. No further seizure activity has been reported.  Past Medical History:  Diagnosis Date  . Anemia   . Asthma   . Complication of anesthesia    had 3 seizures after anesthesia, severe skin rash  . Dyspnea    anemia  . Fibroid   . GERD (gastroesophageal reflux disease)   . History of blood transfusion    01/2017  . Seizures ([place]) 2009   last seizure 2011    Past Surgical History:  Procedure Laterality Date  . MYOMECTOMY     in [place], no fibroids removed  . TONSILLECTOMY  2003    Family History  Problem Relation Age of Onset  . Ulcers Mother   . Asthma Sister    Social History:  reports that she has never smoked. She has never used smokeless tobacco. She reports that she drinks about 0.5 oz of alcohol per week . She reports that she does not use drugs.  Allergies:  Allergies  Allergen Reactions  . Banana Swelling  . Diprivan [Propofol]     seizure  . Fentanyl     seizures  . Versed [Midazolam]     seizure  . Zofran [Ondansetron Hcl]    seizure  . Lamictal [Lamotrigine] Rash    Medications Prior to Admission  Medication Sig Dispense Refill  . Ascorbic Acid (VITAMIN C GUMMIE PO) Take 2 tablets by mouth daily.    . naproxen (NAPROSYN) 500 MG tablet Take 1 tablet (500 mg total) by mouth 2 (two) times daily with a meal. (Patient taking differently: Take 500 mg by mouth 2 (two) times daily as needed (for pain.). ) 30 tablet 0  . norgestimate-ethinyl estradiol (ORTHO-CYCLEN, 28,) 0.25-35 MG-MCG tablet Take 4 tablets tonight. 3 tablets x 3 days. 2 tablets x 2 days. 1 tablet once a day until finishing the pack then start a new pack. (Patient taking differently: Take 1-4 tablets by mouth See admin instructions. Take 4 tablets tonight. 3 tablets x 3 days. 2 tablets x 2 days. 1 tablet once a day until finishing the pack then start a new pack.) 1 Package 1  . oxyCODONE-acetaminophen (ROXICET) 5-325 MG tablet Take 1 tablet by mouth every 8 (eight) hours. PRN for pain (Patient taking differently: Take 1 tablet by mouth every 8 (eight) hours as needed (for pain.). ) 5 tablet 0    ROS: History obtained from the patient  General ROS: negative for - chills, fatigue, fever, night sweats, weight gain or weight loss Psychological ROS: negative for - behavioral disorder, hallucinations, memory difficulties, mood swings or suicidal ideation Ophthalmic ROS: negative for - blurry vision, double vision, eye pain  or loss of vision ENT ROS: negative for - epistaxis, nasal discharge, oral lesions, sore throat, tinnitus or vertigo Allergy and Immunology ROS: negative for - hives or itchy/watery eyes Hematological and Lymphatic ROS: negative for - bleeding problems, bruising or swollen lymph nodes Endocrine ROS: negative for - galactorrhea, hair pattern changes, polydipsia/polyuria or temperature intolerance Respiratory ROS: negative for - cough, hemoptysis, shortness of breath or wheezing Cardiovascular ROS: negative for - chest pain, dyspnea on exertion,  edema or irregular heartbeat Gastrointestinal ROS: negative for - abdominal pain, diarrhea, hematemesis, nausea/vomiting or stool incontinence Genito-Urinary ROS: As noted in present illness Musculoskeletal ROS: negative for - joint swelling or muscular weakness Neurological ROS: as noted in HPI Dermatological ROS: negative for rash and skin lesion changes  Physical Examination: Blood pressure 98/88, pulse 97, temperature 98.7 F (37.1 C), temperature source Oral, resp. rate 17, last menstrual period 01/06/2017, SpO2 99 %.  HEENT-  Normocephalic, no lesions, without obvious abnormality.  Normal external eye and conjunctiva.  Normal TM's bilaterally.  Normal auditory canals and external ears. Normal external nose, mucus membranes and septum.  Normal pharynx. Neck supple with no masses, nodes, nodules or enlargement. Cardiovascular - regular rate and rhythm, S1, S2 normal, no murmur, click, rub or gallop Lungs - chest clear, no wheezing, rales, normal symmetric air entry Abdomen - soft, non-tender; bowel sounds normal; no masses,  no organomegaly Extremities - no joint deformities, effusion, or inflammation and no edema  Neurologic Examination: Mental Status: Alert, oriented, thought content appropriate.  Speech fluent without evidence of aphasia. Able to follow commands without difficulty. Cranial Nerves: II-Visual fields were normal. III/IV/VI-Pupils were equal and reacted normally to light. Extraocular movements were full and conjugate.    V/VII-no facial numbness and no facial weakness. VIII-normal. X-normal speech and symmetrical palatal movement. XII-midline tongue extension with normal strength. Motor: 5/5 bilaterally with normal tone and bulk Sensory: Normal throughout. Deep Tendon Reflexes: 2+ and symmetric. Plantars: Flexor bilaterally Cerebellar: Normal finger-to-nose testing.  Results for orders placed or performed during the hospital encounter of 01/28/17 (from the past  48 hour(s))  Pregnancy, urine     Status: None   Collection Time: 01/28/17 11:45 AM  Result Value Ref Range   Preg Test, Ur NEGATIVE NEGATIVE    Comment:        THE SENSITIVITY OF THIS METHODOLOGY IS >20 mIU/mL.   CBC     Status: Abnormal   Collection Time: 01/28/17 11:55 AM  Result Value Ref Range   WBC 6.6 4.0 - 10.5 K/uL   RBC 4.34 3.87 - 5.11 MIL/uL   Hemoglobin 10.7 (L) 12.0 - 15.0 g/dL   HCT 31.9 (L) 36.0 - 46.0 %   MCV 73.5 (L) 78.0 - 100.0 fL   MCH 24.7 (L) 26.0 - 34.0 pg   MCHC 33.5 30.0 - 36.0 g/dL   RDW 19.8 (H) 11.5 - 15.5 %   Platelets 612 (H) 150 - 400 K/uL  Type and screen [place]     Status: None   Collection Time: 01/28/17 11:55 AM  Result Value Ref Range   ABO/RH(D) O POS    Antibody Screen NEG    Sample Expiration 01/31/2017   CBC     Status: Abnormal   Collection Time: 01/28/17  3:03 PM  Result Value Ref Range   WBC 12.1 (H) 4.0 - 10.5 K/uL   RBC 3.70 (L) 3.87 - 5.11 MIL/uL   Hemoglobin 9.3 (L) 12.0 - 15.0 g/dL   HCT 27.5 (  L) 36.0 - 46.0 %   MCV 74.3 (L) 78.0 - 100.0 fL   MCH 25.1 (L) 26.0 - 34.0 pg   MCHC 33.8 30.0 - 36.0 g/dL   RDW 19.9 (H) 11.5 - 15.5 %   Platelets 539 (H) 150 - 400 K/uL  DIC (disseminated intravasc coag) panel     Status: Abnormal   Collection Time: 01/28/17  3:03 PM  Result Value Ref Range   Prothrombin Time 14.0 11.4 - 15.2 seconds   INR 1.07    aPTT 28 24 - 36 seconds   Fibrinogen 244 210 - 475 mg/dL   D-Dimer, Quant 1.41 (H) 0.00 - 0.50 ug/mL-FEU    Comment: (NOTE) At the manufacturer cut-off of 0.50 ug/mL FEU, this assay has been documented to exclude PE with a sensitivity and negative predictive value of 97 to 99%.  At this time, this assay has not been approved by the FDA to exclude DVT/VTE. Results should be correlated with clinical presentation.    Platelets 528 (H) 150 - 400 K/uL   Smear Review NO SCHISTOCYTES SEEN   Comprehensive metabolic panel     Status: Abnormal   Collection Time:  01/28/17  3:32 PM  Result Value Ref Range   Sodium 137 135 - 145 mmol/L   Potassium 3.8 3.5 - 5.1 mmol/L   Chloride 108 101 - 111 mmol/L   CO2 23 22 - 32 mmol/L   Glucose, Bld 111 (H) 65 - 99 mg/dL   BUN <5 (L) 6 - 20 mg/dL    Comment: REPEATED TO VERIFY   Creatinine, Ser 0.59 0.44 - 1.00 mg/dL   Calcium 8.1 (L) 8.9 - 10.3 mg/dL   Total Protein 6.1 (L) 6.5 - 8.1 g/dL   Albumin 3.6 3.5 - 5.0 g/dL   AST 48 (H) 15 - 41 U/L   ALT 96 (H) 14 - 54 U/L   Alkaline Phosphatase 37 (L) 38 - 126 U/L   Total Bilirubin 0.5 0.3 - 1.2 mg/dL   GFR calc non Af Amer >60 >60 mL/min   GFR calc Af Amer >60 >60 mL/min    Comment: (NOTE) The eGFR has been calculated using the CKD EPI equation. This calculation has not been validated in all clinical situations. eGFR's persistently <60 mL/min signify possible Chronic Kidney Disease.    Anion gap 6 5 - 15   [place] Intraoperative  Result Date: 01/28/2017 CLINICAL DATA:  Ultrasound was provided for use by the ordering physician, and a technical charge was applied by the performing facility.  No radiologist interpretation/professional services rendered.    Assessment/Plan 26 year old lady with remote history of stroke seizures and an induced seizure associated with general anesthesia, with recurrent generalized seizure activity associated with general anesthesia (likely a recurrent induced seizure). Clinical examination was unremarkable.  Recommendations: 1. EEG, routine adult study 2. MRI of the brain with and without contrast 3. No further anticonvulsant medication unless EEG or MRI indicates [place] risk of recurrent seizure activity, or patient has a non-induced recurrent seizure.  We will continue to follow this patient with you.  C.R. Nicole Kindred, [place] [facility] [phone]  01/28/2017, 9:50 PM

## 2017-01-28 NOTE — Anesthesia Preprocedure Evaluation (Signed)
Anesthesia Evaluation  Patient identified by MRN, date of birth, ID band Patient awake    Reviewed: Allergy & Precautions, NPO status , Patient's Chart, lab work & pertinent test results  History of Anesthesia Complications (+) history of anesthetic complications (post anesthesia seizures x2)  Airway Mallampati: I  TM Distance: >3 FB Neck ROM: Full    Dental no notable dental hx.    Pulmonary asthma ,    Pulmonary exam normal breath sounds clear to auscultation       Cardiovascular negative cardio ROS Normal cardiovascular exam Rhythm:Regular Rate:Normal     Neuro/Psych Seizures -, Poorly Controlled,  negative psych ROS   GI/Hepatic Neg liver ROS, neg GERD  ,  Endo/Other  negative endocrine ROS  Renal/GU negative Renal ROS  negative genitourinary   Musculoskeletal negative musculoskeletal ROS (+)   Abdominal   Peds negative pediatric ROS (+)  Hematology negative hematology ROS (+)   Anesthesia Other Findings   Reproductive/Obstetrics negative OB ROS                             Anesthesia Physical  Anesthesia Plan  ASA: II and emergent  Anesthesia Plan: General   Post-op Pain Management:    Induction: Intravenous  Airway Management Planned: LMA  Additional Equipment:   Intra-op Plan:   Post-operative Plan: Extubation in OR  Informed Consent: I have reviewed the patients History and Physical, chart, labs and discussed the procedure including the risks, benefits and alternatives for the proposed anesthesia with the patient or authorized representative who has indicated his/her understanding and acceptance.   Dental advisory given  Plan Discussed with: CRNA  Anesthesia Plan Comments:         Anesthesia Quick Evaluation

## 2017-01-28 NOTE — Progress Notes (Signed)
Pt reacts to insertion of speculum per Dr Royston Sinner and then begins seizure movement x 20 seconds.

## 2017-01-28 NOTE — Interval H&P Note (Signed)
History and Physical Interval Note:  01/28/2017 12:53 PM  Audrey Lang  has presented today for surgery, with the diagnosis of fibroids  The various methods of treatment have been discussed with the patient and family. After consideration of risks, benefits and other options for treatment, the patient has consented to  Procedure(s) with comments: MYOMECTOMY (N/A) - POSSIBLE ABDOMINAL MYOMECTOMY IF UNSUCCESSFUL BELOW. DILATATION & CURETTAGE/HYSTEROSCOPY WITH RESECTOCOPE (N/A) - resection of fibriod  as a surgical intervention .  The patient's history has been reviewed, patient examined, no change in status, stable for surgery.  I have reviewed the patient's chart and labs.  Questions were answered to the patient's satisfaction.     Tyson Dense

## 2017-01-28 NOTE — Op Note (Signed)
PREOPERATIVE DIAGNOSES: 1. Continued bleeding s/p mmy  POSTOPERATIVE DIAGNOSES: Same  PROCEDURE PERFORMED: suction evacuation under US guidance, hysteroscopy, intrauterine foley catheter placement  SURGEON: Dr. Lucillie Garfinkel  ANESTHESIA: General  ESTIMATED BLOOD LOSS: 200cc.  FLUID DEFICIT: 0cc  COMPLICATIONS: None  TUBES: Foley catheter, intrauterine balloon placement  PATHOLOGY: None  FINDINGS:  ~200cc clot evacuated from uterus. No additional bleeding noted  Procedure: The patient was taken to the operating room where she was properly prepped and draped in sterile manner under general anesthesia. After bimanual examination, speculum inserted, the cervix was exposed and a 91mm suction curette was introduced under US-guidance. ~200cc of clot was evacuated and minimal additional blood buildup noted. Lengby introduced and thorough uterine and cervical survey done with no obvious bleeding or oozing noted - just old blood clot that was cleared. Hysteroscope removed. No additional bleeding from the os or build up on [place]. Intrauterine catheter placed with 30cc saline in order to ensure continued hemostasis and tamponade mmy site. US showed no bleeding behind foley over several minutes.  The sponge and lap counts were correct times 2 at this time. The patient's procedure was terminated. We then awakened her. She was sent to the Recovery Room in good condition. Will be transferred to ICU after PACU.   Plan for 2g ancef q 2 hours while intrauterine catheter in place. Plan for removal tomorrow AM if remains stable. Hgb in AM Neurology c/s called. Spoke with Dr. Shon Hale. He will come to see the patient. Verbal order for Keppra 1500mg  IV for loading dose now and Ativan 2mg  IV for any seizure that lasts longer than 2 min. CTM closely for seizures.   Lucillie Garfinkel MD

## 2017-01-28 NOTE — Anesthesia Preprocedure Evaluation (Signed)
Anesthesia Evaluation  Patient identified by MRN, date of birth, ID band Patient awake    Reviewed: Allergy & Precautions, NPO status , Patient's Chart, lab work & pertinent test results  Airway Mallampati: I  TM Distance: >3 FB Neck ROM: Full    Dental no notable dental hx.    Pulmonary asthma ,    Pulmonary exam normal breath sounds clear to auscultation       Cardiovascular negative cardio ROS Normal cardiovascular exam Rhythm:Regular Rate:Normal     Neuro/Psych Seizures -,  negative psych ROS   GI/Hepatic Neg liver ROS, neg GERD  ,  Endo/Other  negative endocrine ROS  Renal/GU negative Renal ROS  negative genitourinary   Musculoskeletal negative musculoskeletal ROS (+)   Abdominal   Peds negative pediatric ROS (+)  Hematology negative hematology ROS (+)   Anesthesia Other Findings   Reproductive/Obstetrics negative OB ROS                             Anesthesia Physical Anesthesia Plan  ASA: II  Anesthesia Plan: General   Post-op Pain Management:    Induction: Intravenous  Airway Management Planned: Oral ETT  Additional Equipment:   Intra-op Plan:   Post-operative Plan: Extubation in OR  Informed Consent: I have reviewed the patients History and Physical, chart, labs and discussed the procedure including the risks, benefits and alternatives for the proposed anesthesia with the patient or authorized representative who has indicated his/her understanding and acceptance.   Dental advisory given  Plan Discussed with: CRNA  Anesthesia Plan Comments:         Anesthesia Quick Evaluation

## 2017-01-28 NOTE — Transfer of Care (Signed)
Immediate Anesthesia Transfer of Care Note  Patient: Audrey Lang  Procedure(s) Performed: Procedure(s) with comments: DILATATION & CURETTAGE/HYSTEROSCOPY WITH MYOSURE (N/A) - resection of fibriod   Patient Location: PACU  Anesthesia Type:General  Level of Consciousness: oriented, sedated and patient cooperative  Airway & Oxygen Therapy: Patient Spontanous Breathing and Patient connected to nasal cannula oxygen  Post-op Assessment: Report given to RN and Post -op Vital signs reviewed and stable  Post vital signs: Reviewed and stable  Last Vitals:  Vitals:   01/28/17 1212  BP: 131/89  Pulse: 87  Resp: 16  Temp: 36.9 C    Last Pain:  Vitals:   01/28/17 1212  TempSrc: Oral  PainSc: 2       Patients Stated Pain Goal: 5 ([id number])  Complications: No apparent anesthesia complications

## 2017-01-28 NOTE — Progress Notes (Signed)
Pt having seizure like movement. 2nd dose valium given. Dr Alfred Levins here with OR team examining patients vaginal bleeding

## 2017-01-29 ENCOUNTER — Encounter (HOSPITAL_COMMUNITY): Payer: Self-pay | Admitting: *Deleted

## 2017-01-29 DIAGNOSIS — R109 Unspecified abdominal pain: Secondary | ICD-10-CM | POA: Diagnosis not present

## 2017-01-29 DIAGNOSIS — R740 Nonspecific elevation of levels of transaminase and lactic acid dehydrogenase [LDH]: Secondary | ICD-10-CM

## 2017-01-29 DIAGNOSIS — D509 Iron deficiency anemia, unspecified: Secondary | ICD-10-CM | POA: Diagnosis present

## 2017-01-29 DIAGNOSIS — R569 Unspecified convulsions: Secondary | ICD-10-CM | POA: Diagnosis not present

## 2017-01-29 DIAGNOSIS — Z9889 Other specified postprocedural states: Secondary | ICD-10-CM

## 2017-01-29 DIAGNOSIS — D250 Submucous leiomyoma of uterus: Secondary | ICD-10-CM | POA: Diagnosis not present

## 2017-01-29 DIAGNOSIS — R531 Weakness: Secondary | ICD-10-CM

## 2017-01-29 DIAGNOSIS — R7401 Elevation of levels of liver transaminase levels: Secondary | ICD-10-CM | POA: Diagnosis present

## 2017-01-29 LAB — COMPREHENSIVE METABOLIC PANEL
ALK PHOS: 32 U/L — AB (ref 38–126)
ALT: 63 U/L — ABNORMAL HIGH (ref 14–54)
ANION GAP: 5 (ref 5–15)
AST: 28 U/L (ref 15–41)
Albumin: 3.1 g/dL — ABNORMAL LOW (ref 3.5–5.0)
BUN: <5 mg/dL — ABNORMAL LOW (ref 6–20)
CALCIUM: 8.2 mg/dL — AB (ref 8.9–10.3)
CO2: 28 mmol/L (ref 22–32)
CREATININE: 0.6 mg/dL (ref 0.44–1.00)
Chloride: 105 mmol/L (ref 101–111)
Glucose, Bld: 101 mg/dL — ABNORMAL HIGH (ref 65–99)
Potassium: 3.3 mmol/L — ABNORMAL LOW (ref 3.5–5.1)
SODIUM: 138 mmol/L (ref 135–145)
TOTAL PROTEIN: 5.3 g/dL — AB (ref 6.5–8.1)
Total Bilirubin: 0.4 mg/dL (ref 0.3–1.2)

## 2017-01-29 LAB — ABO/RH: ABO/RH(D): O POS

## 2017-01-29 LAB — PROTIME-INR
INR: 1.05
Prothrombin Time: 13.8 seconds (ref 11.4–15.2)

## 2017-01-29 LAB — CBC
HCT: 22.4 % — ABNORMAL LOW (ref 36.0–46.0)
HCT: 23.5 % — ABNORMAL LOW (ref 36.0–46.0)
HCT: 22.3 % — ABNORMAL LOW (ref 36.0–46.0)
HEMOGLOBIN: 7.9 g/dL — AB (ref 12.0–15.0)
Hemoglobin: 7.7 g/dL — ABNORMAL LOW (ref 12.0–15.0)
Hemoglobin: 7.2 g/dL — ABNORMAL LOW (ref 12.0–15.0)
MCH: 25.3 pg — AB (ref 26.0–34.0)
MCH: 25 pg — ABNORMAL LOW (ref 26.0–34.0)
MCH: 24.5 pg — AB (ref 26.0–34.0)
MCHC: 34.4 g/dL (ref 30.0–36.0)
MCHC: 33.6 g/dL (ref 30.0–36.0)
MCHC: 32.3 g/dL (ref 30.0–36.0)
MCV: 73.7 fL — ABNORMAL LOW (ref 78.0–100.0)
MCV: 74.4 fL — ABNORMAL LOW (ref 78.0–100.0)
MCV: 75.9 fL — ABNORMAL LOW (ref 78.0–100.0)
PLATELETS: 435 10*3/uL — AB (ref 150–400)
PLATELETS: 395 10*3/uL (ref 150–400)
Platelets: 441 10*3/uL — ABNORMAL HIGH (ref 150–400)
RBC: 3.04 MIL/uL — AB (ref 3.87–5.11)
RBC: 3.16 MIL/uL — AB (ref 3.87–5.11)
RBC: 2.94 MIL/uL — ABNORMAL LOW (ref 3.87–5.11)
RDW: 19.9 % — AB (ref 11.5–15.5)
RDW: 19.8 % — ABNORMAL HIGH (ref 11.5–15.5)
RDW: 19.6 % — AB (ref 11.5–15.5)
WBC: 13.4 10*3/uL — AB (ref 4.0–10.5)
WBC: 12.9 10*3/uL — ABNORMAL HIGH (ref 4.0–10.5)
WBC: 12.1 10*3/uL — ABNORMAL HIGH (ref 4.0–10.5)

## 2017-01-29 MED ORDER — LORAZEPAM 2 MG/ML IJ SOLN
2.0000 mg | INTRAMUSCULAR | Status: DC | PRN
  Filled 2017-01-29 (×2): qty 1

## 2017-01-29 MED ORDER — SODIUM CHLORIDE 0.9% FLUSH
3.0000 mL | Freq: Two times a day (BID) | INTRAVENOUS | Status: DC
  Administered 2017-01-29 – 2017-01-31 (×4): 3 mL via INTRAVENOUS

## 2017-01-29 MED ORDER — PROMETHAZINE HCL 25 MG PO TABS
12.5000 mg | ORAL_TABLET | Freq: Four times a day (QID) | ORAL | Status: DC | PRN
  Administered 2017-01-29: 12.5 mg via ORAL
  Filled 2017-01-29: qty 1

## 2017-01-29 MED ORDER — NORETHIN-ETH ESTRADIOL-FE 0.4-35 MG-MCG PO CHEW
3.0000 | CHEWABLE_TABLET | Freq: Once | ORAL | Status: AC
  Administered 2017-01-30: 3 via ORAL
  Filled 2017-01-29: qty 3

## 2017-01-29 MED ORDER — SODIUM CHLORIDE 0.9 % IV SOLN: 250.0000 mL | INTRAVENOUS | Status: DC | PRN

## 2017-01-29 MED ORDER — OXYCODONE-ACETAMINOPHEN 5-325 MG PO TABS
1.0000 | ORAL_TABLET | ORAL | Status: DC | PRN
  Administered 2017-01-31: 1 via ORAL
  Filled 2017-01-29: qty 1

## 2017-01-29 MED ORDER — MISOPROSTOL 200 MCG PO TABS
200.0000 ug | ORAL_TABLET | Freq: Once | ORAL | Status: AC
  Administered 2017-01-29: 200 ug via ORAL
  Filled 2017-01-29: qty 1

## 2017-01-29 MED ORDER — SODIUM CHLORIDE 0.9% FLUSH
3.0000 mL | Freq: Two times a day (BID) | INTRAVENOUS | Status: DC
  Administered 2017-01-30 – 2017-01-31 (×2): 3 mL via INTRAVENOUS

## 2017-01-29 MED ORDER — NORETHIN-ETH ESTRADIOL-FE 0.4-35 MG-MCG PO CHEW
4.0000 | CHEWABLE_TABLET | Freq: Once | ORAL | Status: AC
  Administered 2017-01-29: 4 via ORAL
  Filled 2017-01-29: qty 4

## 2017-01-29 MED ORDER — SODIUM CHLORIDE 0.9% FLUSH: 3.0000 mL | INTRAVENOUS | Status: DC | PRN

## 2017-01-29 NOTE — Progress Notes (Addendum)
Called by primary MD Royston Sinner. She asks whether patient can have MRI and EEG as an outpatient if her bleeding is stable enough for discharge. Given that her seizures were provoked, that would be fine. Recommend referral to outpatient neurology ([facility] or [facility]) at discharge so they can proceed with workup. Dr. Royston Sinner in agreement and she will place referral.

## 2017-01-29 NOTE — H&P (Signed)
History and Physical    Audrey Lang [id number] DOB: [birth year] DOA: 01/28/2017  PCP: Rachell Cipro, MD   Patient coming from: Home, by way of [facility]   Chief Complaint: Seizures, gen weakness, anemia   HPI: Audrey Lang is a 26 y.o. female with medical history significant for microcytic anemia, leiomyoma with menorrhagia status post myomectomy on 01/28/2017, and seizures, now presenting to [facility] in transfer from [facility] for evaluation of recurrent seizures. Patient has history of seizure disorder and had previously been on antiepileptic, eventually taken off of these remotely. She had a seizure while under general anesthesia in 2016 and was not restarted on suppressive medications as it was triggered by anesthesia. She had remained seizure free until undergoing myomectomy yesterday at the [facility], when she was observed to have generalized convulsive activity which responded to IV benzodiazepines. Neurology consultation was obtained and the patient was to undergo further evaluation with MRI and EEG. There were plans to discharge the patient home from the [facility] today with MRI and EEG to be performed as outpatient, but the patient had an episode just prior to the discharge in which she was found slumped over in an awkward position in her bed, reporting that she had just had a seizure. Case was discussed again with neurology who advised loading the patient with Keppra and transferring her to [facility] for medical observation and neurology consultation.   Review of Systems:  All other systems reviewed and apart from HPI, are negative.  Past Medical History:  Diagnosis Date  . Anemia   . Asthma   . Complication of anesthesia    had 3 seizures after anesthesia, severe skin rash  . Dyspnea    anemia  . Fibroid   . GERD (gastroesophageal reflux disease)   . History of blood transfusion    01/2017  . Seizures [place]) 2009   last  seizure 2011    Past Surgical History:  Procedure Laterality Date  . DILATATION & CURRETTAGE/HYSTEROSCOPY WITH RESECTOCOPE N/A 01/28/2017   Procedure: DILATATION & CURETTAGE/HYSTEROSCOPY WITH MYOSURE;  Surgeon: Audrey Dense, MD;  Location: [facility];  Service: Gynecology;  Laterality: N/A;  resection of fibriod   . DILATION AND EVACUATION N/A 01/28/2017   Procedure: ULTRASOUND GUIDED DILATATION AND EVACUATION;  Surgeon: Audrey Dense, MD;  Location: [facility];  Service: Gynecology;  Laterality: N/A;  . HYSTEROSCOPY N/A 01/28/2017   Procedure: HYSTEROSCOPY WITH INSERTION OF FOLEY CATHETER BALLOON IN UTERUS;  Surgeon: Audrey Dense, MD;  Location: [facility];  Service: Gynecology;  Laterality: N/A;  . MYOMECTOMY     in [place], no fibroids removed  . TONSILLECTOMY  2003     reports that she has never smoked. She has never used smokeless tobacco. She reports that she drinks about 0.5 oz of alcohol per week . She reports that she does not use drugs.  Allergies  Allergen Reactions  . Banana Swelling  . Diprivan [Propofol]     seizure  . Fentanyl     seizures  . Versed [Midazolam]     seizure  . Zofran [Ondansetron Hcl]     seizure  . Lamictal [Lamotrigine] Rash    Family History  Problem Relation Age of Onset  . Ulcers Mother   . Asthma Sister      Prior to Admission medications   Medication Sig Start Date End Date Taking? Authorizing Provider  Ascorbic Acid (VITAMIN C GUMMIE PO) Take 2 tablets by mouth  daily.   Yes Historical Provider, MD  naproxen (NAPROSYN) 500 MG tablet Take 1 tablet (500 mg total) by mouth 2 (two) times daily with a meal. Patient taking differently: Take 500 mg by mouth 2 (two) times daily as needed (for pain.).  01/14/17  Yes Audrey Dense, MD  norgestimate-ethinyl estradiol (ORTHO-CYCLEN, 28,) 0.25-35 MG-MCG tablet Take 4 tablets tonight. 3 tablets x 3 days. 2 tablets x 2 days. 1 tablet once a day until finishing the pack then start a  new pack. Patient taking differently: Take 1-4 tablets by mouth See admin instructions. Take 4 tablets tonight. 3 tablets x 3 days. 2 tablets x 2 days. 1 tablet once a day until finishing the pack then start a new pack. 01/14/17  Yes Audrey Dense, MD  oxyCODONE-acetaminophen (ROXICET) 5-325 MG tablet Take 1 tablet by mouth every 8 (eight) hours. PRN for pain Patient taking differently: Take 1 tablet by mouth every 8 (eight) hours as needed (for pain.).  01/11/17   Audrey Crocker, NP    Physical Exam: Vitals:   01/29/17 1454 01/29/17 1632 01/29/17 1700 01/29/17 1757  BP: (!) 100/57  106/73 105/77  Pulse: (!) 101  93 77  Resp: 20 20 18 18   Temp:   98.3 F (36.8 C) 98.1 F (36.7 C)  TempSrc:   Oral Oral  SpO2: 100%  98% 98%      Constitutional: NAD, calm, comfortable Eyes: PERTLA, lids and conjunctivae normal ENMT: Mucous membranes are moist. Posterior pharynx clear of any exudate or lesions.   Neck: normal, supple, no masses, no thyromegaly Respiratory: clear to auscultation bilaterally, no wheezing, no crackles. Normal respiratory effort.    Cardiovascular: S1 & S2 heard, regular rate and rhythm, no significant murmur. No extremity edema. No significant JVD. Abdomen: No distension, tender in lower abdomen without guarding or rebound tenderness, no masses palpated. Bowel sounds normal.  Musculoskeletal: no clubbing / cyanosis. No joint deformity upper and lower extremities. Normal muscle tone.  Skin: no significant rashes, lesions, ulcers. Warm, dry, well-perfused. Neurologic: CN 2-12 grossly intact. Sensation intact, DTR normal. Strength 5/5 in all 4 limbs.  Psychiatric: Normal judgment and insight. Alert and oriented x 3. Normal mood and affect.     Labs on Admission: I have personally reviewed following labs and imaging studies  CBC:  Recent Labs Lab 01/28/17 1155 01/28/17 1503 01/29/17 0651 01/29/17 1115  WBC 6.6 12.1* 13.4* 12.9*  HGB 10.7* 9.3* 7.7* 7.9*  HCT  31.9* 27.5* 22.4* 23.5*  MCV 73.5* 74.3* 73.7* 74.4*  PLT 612* 528*  539* 435* [id number]*   Basic Metabolic Panel:  Recent Labs Lab 01/28/17 1532  NA 137  K 3.8  CL 108  CO2 23  GLUCOSE 111*  BUN <5*  CREATININE 0.59  CALCIUM 8.1*   GFR: Estimated Creatinine Clearance: 87.9 mL/min (by C-G formula based on SCr of 0.59 mg/dL). Liver Function Tests:  Recent Labs Lab 01/28/17 1532  AST 48*  ALT 96*  ALKPHOS 37*  BILITOT 0.5  PROT 6.1*  ALBUMIN 3.6   No results for input(s): LIPASE, AMYLASE in the last 168 hours. No results for input(s): AMMONIA in the last 168 hours. Coagulation Profile:  Recent Labs Lab 01/28/17 1503  INR 1.07   Cardiac Enzymes: No results for input(s): CKTOTAL, CKMB, CKMBINDEX, TROPONINI in the last 168 hours. BNP (last 3 results) No results for input(s): PROBNP in the last 8760 hours. HbA1C: No results for input(s): HGBA1C in the last 72 hours. CBG:  No results for input(s): GLUCAP in the last 168 hours. Lipid Profile: No results for input(s): CHOL, HDL, LDLCALC, TRIG, CHOLHDL, LDLDIRECT in the last 72 hours. Thyroid Function Tests: No results for input(s): TSH, T4TOTAL, FREET4, T3FREE, THYROIDAB in the last 72 hours. Anemia Panel: No results for input(s): VITAMINB12, FOLATE, FERRITIN, TIBC, IRON, RETICCTPCT in the last 72 hours. Urine analysis:    Component Value Date/Time   COLORURINE RED (A) 01/13/2017 1505   APPEARANCEUR CLOUDY (A) 01/13/2017 1505   LABSPEC  01/13/2017 1505    TEST NOT REPORTED DUE TO COLOR INTERFERENCE OF URINE PIGMENT   PHURINE  01/13/2017 1505    TEST NOT REPORTED DUE TO COLOR INTERFERENCE OF URINE PIGMENT   GLUCOSEU (A) 01/13/2017 1505    TEST NOT REPORTED DUE TO COLOR INTERFERENCE OF URINE PIGMENT   HGBUR (A) 01/13/2017 1505    TEST NOT REPORTED DUE TO COLOR INTERFERENCE OF URINE PIGMENT   BILIRUBINUR (A) 01/13/2017 1505    TEST NOT REPORTED DUE TO COLOR INTERFERENCE OF URINE PIGMENT   BILIRUBINUR neg 08/24/2012  1739   KETONESUR (A) 01/13/2017 1505    TEST NOT REPORTED DUE TO COLOR INTERFERENCE OF URINE PIGMENT   PROTEINUR (A) 01/13/2017 1505    TEST NOT REPORTED DUE TO COLOR INTERFERENCE OF URINE PIGMENT   UROBILINOGEN >8.0 (H) 05/08/2015 1033   NITRITE (A) 01/13/2017 1505    TEST NOT REPORTED DUE TO COLOR INTERFERENCE OF URINE PIGMENT   LEUKOCYTESUR (A) 01/13/2017 1505    TEST NOT REPORTED DUE TO COLOR INTERFERENCE OF URINE PIGMENT   Sepsis Labs: @LABRCNTIP (procalcitonin:4,lacticidven:4) )No results found for this or any previous visit (from the past 240 hour(s)).   Radiological Exams on Admission: [place] Intraoperative  Result Date: 01/28/2017 CLINICAL DATA:  Ultrasound was provided for use by the ordering physician, and a technical charge was applied by the performing facility.  No radiologist interpretation/professional services rendered.    EKG: Not performed, will obtain as appropriate.  Assessment/Plan  1. Seizures  - Pt with remote seizure hx, not on antiepileptics in years, had seizure in 2016 felt to be induced by gen anesthesia and not restarted on antiepileptics  - Now with seizures during myomectomy on 01/28/17 treated with IV benzodiazepines; outpatient MRI and EEG were planned, and if normal, she was likely to be kept off of seizure medications given the suspected etiology being anesthesia   - She had an episode just prior to planned discharge from [facility] in which she was reportedly slumped in an awkward position in her hospital bed, stating that she had just had a seizure  - She has been given Keppra 1500 mg prior to transfer - MRI brain with and without contrast, and EEG have been ordered  - Neurology is consulting and much appreciated, will follow-up recommendations  - Maintain seizure precautions for now, IV Ativan available prn    2. Microcytic anemia  - Hgb 7.9 this am with MCV 74.4, secondary to dysfunctional uterine bleeding  - She underwent myomectomy  01/28/17, placed on OCP taper  - Perform type and screen, she has consented for transfusion if needed - Not tachycardic or pale - Monitor blood-loss, repeat CBC in am    3. Leiomyoma s/p myomectomy  - Pt underwent transvaginal myomectomy on 01/28/17  - Continue prn analgesics with stool softeners, prn laxitives - Following blood-counts as above   4. Elevated transaminases - Mild elevations in AST and ALT with normal bilirubin, lower abdominal pain only  - Possibly secondary  to antiepileptic, no prior LFT's available  - Check RUQ [place] and repeat chem panel in am   5. Generalized weakness  - Pt reports a generalized weakness with difficulty ambulating  - Muscle strength normal to testing and no focal neuro deficits identified  - Likely secondary to anemia, possible contribution from antiepileptics and analgesia  - PT evaluation requested     DVT prophylaxis: SCD's Code Status: Full  Family Communication: Discussed with patient Disposition Plan: Observe on telemetry Consults called: Neurology Admission status: Observation    Vianne Bulls, MD [facility] Pager [phone]  If 7PM-7AM, please contact night-coverage [email or web address] Password [facility]  01/29/2017, 7:46 PM

## 2017-01-29 NOTE — Progress Notes (Signed)
CTBS for patient feeling "weak" when walking. Patient states it is different than when she feels symptomatically anemic - states that her legs shake too much to walk and she cannot bear weight on them. When I attempted to ambulate with the patient she is unable. She is able to stand up and bear weight but then states she cannot  Move her legs forward. She is able to move her legs while in the bed laying down and has normal strength and sensation. However, when she gets up to ambulate, she is unable to move her feet off of the ground. She then sat back down easily is able to swing her legs across the bed to get comfortable. Left the room to speak with neurology - was gone for less than 2 min and found patient on bed turned in the opposite direction of where I left her (feet on her pillows), with arm hanging off of bed and eyes shut. She was responsive but slow to respond - stated "I just had a seizure". AFVSS. Normal oxygen level. Abdomen soft, nontender, nondistended. Pad with dime size clot - changed two hours ago per patient. Bleeding now almost completely resolved - minimal spotting. No s/s of anemia on my exam or by patient's report. Hgb is stable at 7.9. From a GYN standpoint, she is completely stable.  Spoke with Neurology and Hospitalist - Recommend transfer to [facility] for Neurology care.

## 2017-01-29 NOTE — Progress Notes (Signed)
   01/29/17 1540  Charting Type  Charting Type Reassessment  Hourly Rounding  Assessment Alert;Pain improved (see assessment);Patient comfortable;Patient resting;Vital signs WDL  Intervention Call light w/in reach;Pain assessed;Plan of care discussed with pt/other  Pain Assessment  Pain Assessment 0-10  Pain Score 4  Neurological  Level of Consciousness Alert  Orientation Level Oriented X4  Cognition Appropriate at baseline;Appropriate attention/concentration;Appropriate judgement;Appropriate safety awareness  Speech Clear  Seizure Activity  Post- Ictal Other (Comment) (none)  Tasking (Response) Aware of seizure;Normal speech  Equally strong hand grasps & dorsa-extension.

## 2017-01-29 NOTE — Progress Notes (Signed)
No complaints.   AFVSS CV reg rate Pulm NWOB Abd soft nontender nondistended.  GYN no bleeding around intrauterine catheter - 10cc output overnight, intrauterine catheter removed, no bleeding after.   POD#1 s/p Magnet MMY c/b take-back to OR for continued bleeding and athesia-related seizures. Intrauterine catheter placed to tamponade Mmy site. Total EBL 700c. Intrauterine catheter easily removed this am without continued bleeding. She is HDS, with a hgb of 7.7. Exam benign. No s/s of anemia no exam.  Seizures: s/p neuro c/s who stated they were s/s anesthesia. Recommended EEG nd MRI, routine. No further seizures since last night. Will touch base to decide if she needs these as an inpatient vs outpatient. Anticipate home later today w/OCPs and Iron if no further seizures. Stable from a postop standpoint. If further seizures, anticipate transfer to [facility] for further neuro care. Arty Baumgartner MD

## 2017-01-29 NOTE — Progress Notes (Signed)
Report to Rob Bunting EMT with [facility] for pt transport/transfer to 5MW -12. Called receiving unit to give report - RN to call back. 17:40 Report given Orlean Bradford, RN 5MW

## 2017-01-29 NOTE — Progress Notes (Signed)
Dr. Royston Sinner in to discuss POC w/ pt.  Pt attempted to stand/walk w/ MD.  MD out of room for approximately 1-2 min to call neurologist.  Upon returning to room MD found pt unresponsive & slumped over in bed.  MD & Carter Kitten, RN repositioned pt to right side, O2 monitor applied & was 99%.  After a few min pt was able to nod to respond to questions.  After about 10 min pt able to speak & states that she felt her arms start to shake & get numb after standing.  She sat down & then realized seizure was coming.  She attempted to press call button but was unable.

## 2017-01-30 ENCOUNTER — Observation Stay (HOSPITAL_COMMUNITY): Payer: BLUE CROSS/BLUE SHIELD

## 2017-01-30 ENCOUNTER — Other Ambulatory Visit (HOSPITAL_COMMUNITY): Payer: BLUE CROSS/BLUE SHIELD

## 2017-01-30 DIAGNOSIS — D509 Iron deficiency anemia, unspecified: Secondary | ICD-10-CM | POA: Diagnosis present

## 2017-01-30 DIAGNOSIS — D250 Submucous leiomyoma of uterus: Secondary | ICD-10-CM | POA: Diagnosis not present

## 2017-01-30 DIAGNOSIS — D649 Anemia, unspecified: Secondary | ICD-10-CM | POA: Diagnosis present

## 2017-01-30 DIAGNOSIS — Z9889 Other specified postprocedural states: Secondary | ICD-10-CM | POA: Diagnosis not present

## 2017-01-30 DIAGNOSIS — R740 Nonspecific elevation of levels of transaminase and lactic acid dehydrogenase [LDH]: Secondary | ICD-10-CM | POA: Diagnosis not present

## 2017-01-30 DIAGNOSIS — R569 Unspecified convulsions: Secondary | ICD-10-CM | POA: Diagnosis not present

## 2017-01-30 DIAGNOSIS — R531 Weakness: Secondary | ICD-10-CM | POA: Diagnosis not present

## 2017-01-30 LAB — CBC
HCT: 22.4 % — ABNORMAL LOW (ref 36.0–46.0)
HEMOGLOBIN: 7.2 g/dL — AB (ref 12.0–15.0)
MCH: 24.3 pg — AB (ref 26.0–34.0)
MCHC: 32.1 g/dL (ref 30.0–36.0)
MCV: 75.7 fL — ABNORMAL LOW (ref 78.0–100.0)
PLATELETS: 426 10*3/uL — AB (ref 150–400)
RBC: 2.96 MIL/uL — ABNORMAL LOW (ref 3.87–5.11)
RDW: 20 % — ABNORMAL HIGH (ref 11.5–15.5)
WBC: 11.5 10*3/uL — ABNORMAL HIGH (ref 4.0–10.5)

## 2017-01-30 LAB — BASIC METABOLIC PANEL
ANION GAP: 5 (ref 5–15)
BUN: <5 mg/dL — ABNORMAL LOW (ref 6–20)
CO2: 27 mmol/L (ref 22–32)
CREATININE: 0.54 mg/dL (ref 0.44–1.00)
Calcium: 8.2 mg/dL — ABNORMAL LOW (ref 8.9–10.3)
Chloride: 104 mmol/L (ref 101–111)
GLUCOSE: 94 mg/dL (ref 65–99)
Potassium: 3.4 mmol/L — ABNORMAL LOW (ref 3.5–5.1)
Sodium: 136 mmol/L (ref 135–145)

## 2017-01-30 LAB — IRON AND TIBC
IRON: 16 ug/dL — AB (ref 28–170)
SATURATION RATIOS: 4 % — AB (ref 10.4–31.8)
TIBC: 416 ug/dL (ref 250–450)
UIBC: 400 ug/dL

## 2017-01-30 LAB — VITAMIN B12: VITAMIN B 12: 283 pg/mL (ref 180–914)

## 2017-01-30 LAB — FOLATE: FOLATE: 9.9 ng/mL (ref >5.9)

## 2017-01-30 LAB — PREPARE RBC (CROSSMATCH)

## 2017-01-30 LAB — HIV ANTIBODY (ROUTINE TESTING W REFLEX): HIV SCREEN 4TH GENERATION: NONREACTIVE

## 2017-01-30 LAB — MAGNESIUM: MAGNESIUM: 1.8 mg/dL (ref 1.7–2.4)

## 2017-01-30 LAB — FERRITIN: Ferritin: 4 ng/mL — ABNORMAL LOW (ref 11–307)

## 2017-01-30 MED ORDER — FUROSEMIDE 10 MG/ML IJ SOLN
20.0000 mg | Freq: Once | INTRAMUSCULAR | Status: AC
  Administered 2017-01-30: 20 mg via INTRAVENOUS
  Filled 2017-01-30 (×2): qty 4

## 2017-01-30 MED ORDER — ACETAMINOPHEN 325 MG PO TABS
650.0000 mg | ORAL_TABLET | Freq: Once | ORAL | Status: AC
  Administered 2017-01-30: 650 mg via ORAL
  Filled 2017-01-30: qty 2

## 2017-01-30 MED ORDER — SODIUM CHLORIDE 0.9 % IV SOLN
Freq: Once | INTRAVENOUS | Status: AC
  Administered 2017-01-30: 13:00:00 via INTRAVENOUS

## 2017-01-30 MED ORDER — POTASSIUM CHLORIDE CRYS ER 20 MEQ PO TBCR
40.0000 meq | EXTENDED_RELEASE_TABLET | Freq: Once | ORAL | Status: AC
  Administered 2017-01-30: 40 meq via ORAL
  Filled 2017-01-30: qty 2

## 2017-01-30 MED ORDER — CYANOCOBALAMIN 1000 MCG/ML IJ SOLN
1000.0000 ug | Freq: Every day | INTRAMUSCULAR | Status: DC
  Administered 2017-01-30 – 2017-01-31 (×2): 1000 ug via INTRAMUSCULAR
  Filled 2017-01-30 (×2): qty 1

## 2017-01-30 MED ORDER — DIPHENHYDRAMINE HCL 25 MG PO CAPS
25.0000 mg | ORAL_CAPSULE | Freq: Once | ORAL | Status: AC
  Administered 2017-01-30: 25 mg via ORAL
  Filled 2017-01-30 (×2): qty 1

## 2017-01-30 MED ORDER — SENNOSIDES-DOCUSATE SODIUM 8.6-50 MG PO TABS
1.0000 | ORAL_TABLET | Freq: Two times a day (BID) | ORAL | Status: DC
  Filled 2017-01-30: qty 1

## 2017-01-30 NOTE — Progress Notes (Signed)
PROGRESS NOTE    Audrey Lang  [id number] DOB: Dec 23, 1990 DOA: 01/28/2017 PCP: Rachell Cipro, MD    Brief Narrative:   Audrey Lang is a 26 y.o. female with medical history significant for microcytic anemia, leiomyoma with menorrhagia status post myomectomy on 01/28/2017, and seizures, now presenting to [facility] in transfer from [facility] for evaluation of recurrent seizures. Patient has history of seizure disorder and had previously been on antiepileptic, eventually taken off of these remotely. She had a seizure while under general anesthesia in 2016 and was not restarted on suppressive medications as it was triggered by anesthesia. She had remained seizure free until undergoing myomectomy yesterday at the [facility], when she was observed to have generalized convulsive activity which responded to IV benzodiazepines. Neurology consultation was obtained and the patient was to undergo further evaluation with MRI and EEG. There were plans to discharge the patient home from the [facility] today with MRI and EEG to be performed as outpatient, but the patient had an episode just prior to the discharge in which she was found slumped over in an awkward position in her bed, reporting that she had just had a seizure. Case was discussed again with neurology who advised loading the patient with Keppra and transferring her to [facility] for medical observation and neurology consultation.    Assessment & Plan:   Principal Problem:   Seizures ([place]) Active Problems:   H/O myomectomy   Weakness generalized   Microcytic anemia   Transaminasemia   Abdominal pain   #1 seizures Felt likely induced with general anesthesia. Patient with no focal neurologic deficits except lower extremity weakness. No noted seizures overnight. EEG ordered however patient stated recently put of leaving her couple days ago and was very expensive and a such is refusing EEG to be done. MRI of  the head pending. Will replete electrolytes. 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 IV Ativan as needed. Neurology following and appreciate input and recommendations.  #2 iron deficiency anemia/microcytic anemia/? Symptomatic anemia Patient with a history of Leomyoma and menorrhagia status post myomectomy on 01/28/2017. Hemoglobin was 10.7 on 01/28/2017 and currently at 7.2 today 01/30/2017. Likely multifactorial secondary to menorrhagia and postop blood loss. Due to patient's complaints of lower extremity weakness and significant drop in hemoglobin will transfuse 2 units packed red blood cells and follow H&H. Anemia panel consistent with iron deficiency anemia. Will also give IV Feraheme 1 tomorrow to 25 2018. Patient will likely need oral iron supplementations on discharge.  #3 transaminitis Patient with mild elevations in AST and ALT within normal bilirubin with lower abdominal pain. Right upper quadrant abdominal ultrasound unremarkable. LFTs trending down. Repeat CMET in the morning. Outpatient follow-up.  #4 Leiomyoma status post myomectomy Patient status post transvaginal myomectomy on 01/28/2017. Continue as needed analgesics with stool softeners, laxatives as needed. H&H at 7.2 today from 10.7 on 01/28/2017. See problem #2.  #5 generalized weakness Patient with complaints of lower extremity weakness and difficulty ambulating. Patient with normal muscle strength and no focal neurological deficits. May be secondary to symptomatic anemia. Will transfuse 2 units packed red blood cells and follow. Patient has been assessed by physical therapy were recommending home health PT with 24-hour supervision/assistance.   DVT prophylaxis: scds Code Status: Full Family Communication: Updated patient. No family at bedside. Disposition Plan:  Home once patient  with  no further seizures and seizure workup has been completed. Hemoglobin stabilized. Per neurology.  Consultants:   Neurology: Dr. Nicole Kindred 01/28/2017  Procedures:  Right upper quadrant abdominal ultrasound 01/30/2017--- negative  MRI brain 01/30/2017 pending   2 units packed red blood cells pending 01/30/2017  Antimicrobials:   None   Subjective: Patient sitting up in chair eating lunch. Patient denies any chest pain. No shortness of breath. Patient with complaints of significant lower extremity weakness. Patient states recently weaved her hair and a such unable to undergo EEG as she would have to remove all of them out. Patient states they were expensive. Patient with no further episodes of seizure activity noted.  Objective: Vitals:   01/29/17 2149 01/30/17 0150 01/30/17 0531 01/30/17 1032  BP: (!) 102/57 (!) 101/54 (!) 107/56 120/69  Pulse: 89 94 86 (!) 101  Resp: 18 18 18 18   Temp: 98 F (36.7 C) 98.3 F (36.8 C) 98.8 F (37.1 C) 98.8 F (37.1 C)  TempSrc: Oral Oral Oral Oral  SpO2: 100% 100% 98% 100%   No intake or output data in the 24 hours ending 01/30/17 1221 There were no vitals filed for this visit.  Examination:  General exam: Appears calm and comfortable  Respiratory system: Clear to auscultation. Respiratory effort normal. Cardiovascular system: S1 & S2 heard, RRR. No JVD, murmurs, rubs, gallops or clicks. No pedal edema. Gastrointestinal system: Abdomen is nondistended, soft and nontender. No organomegaly or masses felt. Normal bowel sounds heard. Central nervous system: Alert and oriented. No focal neurological deficits. Extremities: 5/5 BUE strength, , 3-4/5 BLE strength. Skin: No rashes, lesions or ulcers Psychiatry: Judgement and insight appear normal. Mood & affect appropriate.     Data Reviewed: I have personally reviewed following labs and imaging studies  CBC:  Recent Labs Lab 01/28/17 1503 01/29/17 0651 01/29/17 1115 01/29/17 2048  01/30/17 0324  WBC 12.1* 13.4* 12.9* 12.1* 11.5*  HGB 9.3* 7.7* 7.9* 7.2* 7.2*  HCT 27.5* 22.4* 23.5* 22.3* 22.4*  MCV 74.3* 73.7* 74.4* 75.9* 75.7*  PLT 528*  539* 435* 441* 395 [id number]*   Basic Metabolic Panel:  Recent Labs Lab 01/28/17 1532 01/29/17 2002 01/30/17 0324 01/30/17 0852  NA 137 138 136  --   K 3.8 3.3* 3.4*  --   CL 108 105 104  --   CO2 23 28 27   --   GLUCOSE 111* 101* 94  --   BUN <5* <5* <5*  --   CREATININE 0.59 0.60 0.54  --   CALCIUM 8.1* 8.2* 8.2*  --   MG  --   --   --  1.8   GFR: Estimated Creatinine Clearance: 87.9 mL/min (by C-G formula based on SCr of 0.54 mg/dL). Liver Function Tests:  Recent Labs Lab 01/28/17 1532 01/29/17 2002  AST 48* 28  ALT 96* 63*  ALKPHOS 37* 32*  BILITOT 0.5 0.4  PROT 6.1* 5.3*  ALBUMIN 3.6 3.1*   No results for input(s): LIPASE, AMYLASE in the last 168 hours. No results for input(s): AMMONIA in the last 168 hours. Coagulation Profile:  Recent Labs Lab 01/28/17 1503 01/29/17 2002  INR 1.07 1.05   Cardiac Enzymes: No results for input(s): CKTOTAL, CKMB, CKMBINDEX, TROPONINI in the last 168 hours. BNP (last 3 results) No results for input(s): PROBNP in the last 8760 hours. HbA1C: No results for input(s): HGBA1C in the last 72 hours. CBG: No results for input(s): GLUCAP in the last 168 hours. Lipid Profile: No results for input(s): CHOL, HDL,  LDLCALC, TRIG, CHOLHDL, LDLDIRECT in the last 72 hours. Thyroid Function Tests: No results for input(s): TSH, T4TOTAL, FREET4, T3FREE, THYROIDAB in the last 72 hours. Anemia Panel:  Recent Labs  01/30/17 0852  VITAMINB12 283  FOLATE 9.9  FERRITIN 4*  TIBC 416  IRON 16*   Sepsis Labs: No results for input(s): PROCALCITON, LATICACIDVEN in the last 168 hours.  No results found for this or any previous visit (from the past 240 hour(s)).       Radiology Studies: [place] Intraoperative  Result Date: 01/28/2017 CLINICAL DATA:  Ultrasound was provided for use  by the ordering physician, and a technical charge was applied by the performing facility.  No radiologist interpretation/professional services rendered.   US Abdomen Limited Ruq  Result Date: 01/30/2017 CLINICAL DATA:  Abdominal pain for 1 day.  Elevated liver enzymes. EXAM: US ABDOMEN LIMITED - RIGHT UPPER QUADRANT COMPARISON:  None. FINDINGS: Gallbladder: No gallstones or wall thickening visualized. No sonographic Murphy sign noted by sonographer. Gallbladder wall thickness is 2 mm, within normal limits. Common bile duct: Diameter: 3.0 mm, within normal limits. Liver: No focal lesion identified. Within normal limits in parenchymal echogenicity. IMPRESSION: Negative right upper quadrant ultrasound. Electronically Signed   By: San Morelle M.D.   On: 01/30/2017 09:38        Scheduled Meds: . docusate sodium  100 mg Oral BID  . pantoprazole  40 mg Oral Daily  . sodium chloride flush  3 mL Intravenous Q12H  . sodium chloride flush  3 mL Intravenous Q12H   Continuous Infusions:   LOS: 0 days    Time spent: 31 minutes    Hades Mathew, MD [facility] Pager [phone]  If 7PM-7AM, please contact night-coverage [email or web address] Password [facility] 01/30/2017, 12:21 PM

## 2017-01-30 NOTE — Progress Notes (Signed)
Attempt to give IV medications, flushed saline lock, patient complaints of "burning, pain" at IV site.  Order in for IV team to evaluate.

## 2017-01-30 NOTE — Progress Notes (Signed)
MRI brain completed. I have personally and independently reviewed the scan. It is completely normal. She refused IV contrast for the scan.   No additional recs apart from continued PT. Will sign off. Call if any new issues arise.

## 2017-01-30 NOTE — Evaluation (Signed)
Physical Therapy Evaluation Patient Details Name: Audrey Lang MRN: [id number] DOB: 1991-07-17 Today's Date: 01/30/2017   History of Present Illness  Audrey Lang is a 26 y.o. female with medical history significant for microcytic anemia, leiomyoma with menorrhagia status post myomectomy on 01/28/2017, and seizures, now presenting to [facility] in transfer from [facility] for evaluation of recurrent seizures.  Clinical Impression  Patient present with decreased mobility due to deficits listed in PT problem list.  She has inconsistent weakness (L>R) some decreased coordination on L and general imbalance, poor upright orientation and some ataxic qualities to her gait.  Feel she should resolve with continued therapy and be able to go home, but will need to be able to negotiate stairs for home entry prior to d/c home.  Encouraged increased mobility with BSC rather than bedpan with staff assist only.  Will continue to follow during acute stay.     Follow Up Recommendations Supervision/Assistance - 24 hour;Home health PT    Equipment Recommendations  Rolling walker with 5" wheels    Recommendations for Other Services       Precautions / Restrictions Precautions Precautions: Fall      Mobility  Bed Mobility Overal bed mobility: Needs Assistance Bed Mobility: Supine to Sit     Supine to sit: Supervision;HOB elevated     General bed mobility comments: cues for technique up from sidelying due to abdominal incision; increased time  Transfers Overall transfer level: Needs assistance Equipment used: None;Rolling walker (2 wheeled) Transfers: Sit to/from [facility] Sit to Stand: Mod assist;Max assist Stand pivot transfers: Mod assist       General transfer comment: initially no device and pt unable to stand erect without significant assist, then with RW increased time, support to rise, then cues for erect posture tactile on shoulders and hips with  cue for forward gaze; pivot to recliner from bed with RW min/mod A  Ambulation/Gait Ambulation/Gait assistance: Mod assist Ambulation Distance (Feet): 4 Feet Assistive device: Rolling walker (2 wheeled) Gait Pattern/deviations: Step-to pattern;Decreased stride length;Trunk flexed;Decreased dorsiflexion - left;Steppage;Ataxic     General Gait Details: initially leaning forward, then to R, with cues for visual orientation and erect posture, then able to take steps but increased time and very large steps with cues and support for balance; after two steps pt reported legs felt numb so cues for weight shift and stepping backwards back to bed with improved upright posture and more graded steps.   Stairs            Wheelchair Mobility    Modified Rankin (Stroke Patients Only)       Balance Overall balance assessment: Needs assistance   Sitting balance-Leahy Scale: Good     Standing balance support: Bilateral upper extremity supported Standing balance-Leahy Scale: Poor Standing balance comment: needs support and UE assist for balance, initially bending forward, then lateral to L; corrected with cues, visual orientation and assist                             Pertinent Vitals/Pain Pain Assessment: 0-10 Pain Score: 7  Pain Location: abdomen Pain Descriptors / Indicators: Sore;Operative site guarding Pain Intervention(s): Monitored during session    Home Living Family/patient expects to be discharged to:: Private residence Living Arrangements: Alone Available Help at Discharge: Family;Available 24 hours/day Type of Home: Apartment Home Access: Stairs to enter Entrance Stairs-Rails: Right Entrance Stairs-Number of Steps: flight Home Layout: One  level Home Equipment: None Additional Comments: mother on her way from [place] to stay with pt    Prior Function Level of Independence: Independent         Comments: senior in communications at [employer]         Extremity/Trunk Assessment   Upper Extremity Assessment Upper Extremity Assessment: LUE deficits/detail LUE Deficits / Details: decreased grip compared to R, decr coordination with pron/sup and touching fingers to thumb    Lower Extremity Assessment Lower Extremity Assessment: LLE deficits/detail LLE Deficits / Details: reports numbness lateral apect lower leg, strength hip flexion 3/5, knee extension 4-/5, ankle DF 3+/5 LLE Sensation: decreased light touch       Communication   Communication: No difficulties  Cognition Arousal/Alertness: Awake/alert Behavior During Therapy: WFL for tasks assessed/performed Overall Cognitive Status: Within Functional Limits for tasks assessed                      General Comments      Exercises     Assessment/Plan    PT Assessment Patient needs continued PT services  PT Problem List Decreased balance;Impaired sensation;Decreased knowledge of use of DME;Decreased activity tolerance;Decreased mobility;Decreased safety awareness;Pain       PT Treatment Interventions DME instruction;Gait training;Therapeutic exercise;Therapeutic activities;Balance training;Patient/family education;Stair training;Functional mobility training    PT Goals (Current goals can be found in the Care Plan section)  Acute Rehab PT Goals Patient Stated Goal: To get better PT Goal Formulation: With patient Time For Goal Achievement: 02/06/17 Potential to Achieve Goals: Good    Frequency Min 3X/week   Barriers to discharge        Co-evaluation               End of Session Equipment Utilized During Treatment: Gait belt Activity Tolerance: Patient limited by fatigue Patient left: in chair;with call bell/phone within reach;with nursing/sitter in room;with chair alarm set   PT Visit Diagnosis: Other abnormalities of gait and mobility (R26.89);Ataxic gait (R26.0)    Functional Assessment Tool Used: Clinical judgement Functional  Limitation: Mobility: Walking and moving around Mobility: Walking and Moving Around Current Status [id number]): At least 40 percent but less than 60 percent impaired, limited or restricted Mobility: Walking and Moving Around Goal Status [phone]): At least 20 percent but less than 40 percent impaired, limited or restricted    Time: 1122-1150 PT Time Calculation (min) (ACUTE ONLY): 28 min   Charges:   PT Evaluation $PT Eval Moderate Complexity: 1 Procedure PT Treatments $Gait Training: 8-22 mins   PT G Codes:   PT G-Codes **NOT FOR INPATIENT CLASS** Functional Assessment Tool Used: Clinical judgement Functional Limitation: Mobility: Walking and moving around Mobility: Walking and Moving Around Current Status [id number]): At least 40 percent but less than 60 percent impaired, limited or restricted Mobility: Walking and Moving Around Goal Status [phone]): At least 20 percent but less than 40 percent impaired, limited or restricted     Audrey Lang 01/30/2017, 1:02 PM  Magda Kiel, [place] 01/30/2017

## 2017-01-30 NOTE — Progress Notes (Signed)
Patient refused her EEG and told the technologist that she spent too much money on her weave and didn't want it to get messed up. 

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
## 2017-01-30 NOTE — Progress Notes (Signed)
Patient  Refused EEG, stated she just got her weave a couple days ago and too much money to have it messed up. Patient stated her brain was fine she didn't need EEG. Dr. Shon Hale with neurology notified.

## 2017-01-31 DIAGNOSIS — Z9889 Other specified postprocedural states: Secondary | ICD-10-CM | POA: Diagnosis not present

## 2017-01-31 DIAGNOSIS — D649 Anemia, unspecified: Secondary | ICD-10-CM | POA: Diagnosis not present

## 2017-01-31 DIAGNOSIS — R569 Unspecified convulsions: Secondary | ICD-10-CM | POA: Diagnosis not present

## 2017-01-31 DIAGNOSIS — D509 Iron deficiency anemia, unspecified: Secondary | ICD-10-CM | POA: Diagnosis not present

## 2017-01-31 DIAGNOSIS — D250 Submucous leiomyoma of uterus: Secondary | ICD-10-CM | POA: Diagnosis not present

## 2017-01-31 LAB — TYPE AND SCREEN
ABO/RH(D): O POS
ANTIBODY SCREEN: NEGATIVE
Unit division: 0
Unit division: 0

## 2017-01-31 LAB — GLUCOSE, CAPILLARY: Glucose-Capillary: 79 mg/dL (ref 65–99)

## 2017-01-31 LAB — COMPREHENSIVE METABOLIC PANEL
ALK PHOS: 40 U/L (ref 38–126)
ALT: 51 U/L (ref 14–54)
AST: 26 U/L (ref 15–41)
Albumin: 3.3 g/dL — ABNORMAL LOW (ref 3.5–5.0)
Anion gap: 8 (ref 5–15)
CALCIUM: 8.4 mg/dL — AB (ref 8.9–10.3)
CHLORIDE: 103 mmol/L (ref 101–111)
CO2: 27 mmol/L (ref 22–32)
CREATININE: 0.7 mg/dL (ref 0.44–1.00)
GFR calc Af Amer: >60 mL/min (ref >60)
Glucose, Bld: 96 mg/dL (ref 65–99)
Potassium: 3.5 mmol/L (ref 3.5–5.1)
SODIUM: 138 mmol/L (ref 135–145)
Total Bilirubin: 0.7 mg/dL (ref 0.3–1.2)
Total Protein: 6.2 g/dL — ABNORMAL LOW (ref 6.5–8.1)

## 2017-01-31 LAB — CBC
HCT: 34.1 % — ABNORMAL LOW (ref 36.0–46.0)
Hemoglobin: 11.1 g/dL — ABNORMAL LOW (ref 12.0–15.0)
MCH: 26.1 pg (ref 26.0–34.0)
MCHC: 32.6 g/dL (ref 30.0–36.0)
MCV: 80 fL (ref 78.0–100.0)
PLATELETS: 360 10*3/uL (ref 150–400)
RBC: 4.26 MIL/uL (ref 3.87–5.11)
RDW: 18.3 % — ABNORMAL HIGH (ref 11.5–15.5)
WBC: 9.9 10*3/uL (ref 4.0–10.5)

## 2017-01-31 LAB — MAGNESIUM: Magnesium: 1.7 mg/dL (ref 1.7–2.4)

## 2017-01-31 MED ORDER — FUROSEMIDE 10 MG/ML IJ SOLN
40.0000 mg | Freq: Once | INTRAMUSCULAR | Status: AC
  Administered 2017-01-31: 40 mg via INTRAVENOUS
  Filled 2017-01-31: qty 4

## 2017-01-31 MED ORDER — SENNOSIDES-DOCUSATE SODIUM 8.6-50 MG PO TABS: 1.0000 | ORAL_TABLET | Freq: Two times a day (BID) | ORAL | Status: DC

## 2017-01-31 MED ORDER — SODIUM CHLORIDE 0.9 % IV SOLN
510.0000 mg | Freq: Once | INTRAVENOUS | Status: AC
  Administered 2017-01-31: 510 mg via INTRAVENOUS
  Filled 2017-01-31 (×2): qty 17

## 2017-01-31 MED ORDER — CYANOCOBALAMIN 1000 MCG/ML IJ SOLN: 1000.0000 ug | Freq: Every day | INTRAMUSCULAR | 0 refills | Status: DC

## 2017-01-31 MED ORDER — POTASSIUM CHLORIDE CRYS ER 20 MEQ PO TBCR
40.0000 meq | EXTENDED_RELEASE_TABLET | Freq: Once | ORAL | Status: AC
  Administered 2017-01-31: 40 meq via ORAL
  Filled 2017-01-31: qty 2

## 2017-01-31 MED ORDER — POLYSACCHARIDE IRON COMPLEX 150 MG PO CAPS: 150.0000 mg | ORAL_CAPSULE | Freq: Two times a day (BID) | ORAL | Status: DC

## 2017-01-31 MED ORDER — MAGNESIUM SULFATE 2 GM/50ML IV SOLN
2.0000 g | Freq: Once | INTRAVENOUS | Status: AC
  Administered 2017-01-31: 2 g via INTRAVENOUS
  Filled 2017-01-31 (×2): qty 50

## 2017-01-31 NOTE — Progress Notes (Signed)
Patient left unit by wheelchair accompanied by staff.  

## 2017-01-31 NOTE — Plan of Care (Signed)
Problem: Acute Rehab PT Goals(only PT should resolve) Goal: Pt Will Go Up/Down Stairs Outcome: Not Met (add Reason) Patient unable to attempt stairs.  Instructed to bump up stairs on bottom.  Will then work with [place] on negotiating stairs.

## 2017-01-31 NOTE — Progress Notes (Signed)
Patient given discharge instructions.  All questions and concerns addressed.  Teachback from patient regarding intramuscular injections of prescribed medication.  IV catheter removed without difficulty.  Telemonitor removed and verified.

## 2017-01-31 NOTE — Progress Notes (Signed)
Physical Therapy Treatment Patient Details Name: Audrey Lang MRN: [id number] DOB: 10/23/1991 Today's Date: 01/31/2017    History of Present Illness Audrey Lang is a 26 y.o. female with medical history significant for microcytic anemia, leiomyoma with menorrhagia status post myomectomy on 01/28/2017, and seizures, now presenting to [facility] in transfer from [facility] for evaluation of recurrent seizures.    PT Comments    Patient making slow progress with mobility.  Provided exercise to add to the ones she is doing.  Discussed stairs - patient to bump up the 14 stairs into her apartment.  Agree with need for HHPT at d/c.   Follow Up Recommendations  Supervision/Assistance - 24 hour;Home health PT     Equipment Recommendations  Rolling walker with 5" wheels    Recommendations for Other Services       Precautions / Restrictions Precautions Precautions: Fall Restrictions Weight Bearing Restrictions: No    Mobility  Bed Mobility Overal bed mobility: Needs Assistance Bed Mobility: Supine to Sit;Sit to Supine     Supine to sit: Min guard;HOB elevated Sit to supine: Supervision   General bed mobility comments: Assist to raise trunk to upright sitting position  Transfers Overall transfer level: Needs assistance Equipment used: Rolling walker (2 wheeled) Transfers: Sit to/from Stand Sit to Stand: Mod assist         General transfer comment: Verbal cues for hand placement, to scoot to edge of bed, and placement of feet.  Mod assist to power up to standing x2.  Flexed posture - able to move to upright posture in static stance.  Flexes with gait.  Ambulation/Gait Ambulation/Gait assistance: Min assist;Mod assist Ambulation Distance (Feet): 5 Feet (5' x2) Assistive device: Rolling walker (2 wheeled) Gait Pattern/deviations: Step-through pattern;Decreased step length - right;Decreased step length - left;Decreased stride length;Decreased  dorsiflexion - left;Shuffle;Trunk flexed Gait velocity: decreased Gait velocity interpretation: Below normal speed for age/gender General Gait Details: Patient with slow, unsteady gait, leaning forward over RW.  Patient takes slow steps during swing phase, and decreased balance posteriorly.  Able to take 4 steps forward and then backward with assist for balance x2 with sitting rest break.   Stairs Stairs:  (Unable. Patient to bump up steps on bottom)          Wheelchair Mobility    Modified Rankin (Stroke Patients Only)       Balance           Standing balance support: Bilateral upper extremity supported Standing balance-Leahy Scale: Poor                      Cognition Arousal/Alertness: Awake/alert Behavior During Therapy: WFL for tasks assessed/performed;Flat affect Overall Cognitive Status: Within Functional Limits for tasks assessed                      Exercises Total Joint Exercises Bridges: AROM;Both;5 reps;Supine    General Comments        Pertinent Vitals/Pain Pain Assessment: 0-10 Pain Score: 4  Pain Location: abdomen Pain Descriptors / Indicators: Sore;Operative site guarding Pain Intervention(s): Monitored during session;Repositioned    Home Living                      Prior Function            PT Goals (current goals can now be found in the care plan section) Acute Rehab PT Goals Patient Stated Goal: To get better  Progress towards PT goals: Progressing toward goals    Frequency    Min 3X/week      PT Plan Current plan remains appropriate    Co-evaluation             End of Session Equipment Utilized During Treatment: Gait belt Activity Tolerance: Patient limited by fatigue Patient left: in bed;with call bell/phone within reach;with family/visitor present Nurse Communication: Mobility status PT Visit Diagnosis: Other abnormalities of gait and mobility (R26.89);Ataxic gait (R26.0)     Time:  [id number] PT Time Calculation (min) (ACUTE ONLY): 29 min  Charges:  $Gait Training: 23-37 mins                    G Codes:  Functional Assessment Tool Used: AM-PAC 6 Clicks Basic Mobility;Clinical judgement Functional Limitation: Mobility: Walking and moving around Mobility: Walking and Moving Around Goal Status [id number]): At least 20 percent but less than 40 percent impaired, limited or restricted Mobility: Walking and Moving Around Discharge Status [phone]): At least 40 percent but less than 60 percent impaired, limited or restricted    Despina Pole 01/31/2017, 8:22 PM Carita Pian. Sanjuana Kava, [place] Pager [phone]

## 2017-01-31 NOTE — Discharge Summary (Signed)
Physician Discharge Summary  Audrey Lang [id number] DOB: 29-Sep-1991 DOA: 01/28/2017  PCP: Rachell Cipro, MD  Admit date: 01/28/2017 Discharge date: 01/31/2017  Time spent: 65 minutes  Recommendations for Outpatient Follow-up:  1. Follow-up with Dr. Royston Sinner OB/GYN in 1-2 weeks. On follow-up patient will need a CBC done to follow-up on her hemoglobin. 2. Follow-up with [facility], MD in 2 weeks. On follow-up patient will need a basic metabolic profile done to follow-up on electrolytes and renal function. Patient will also need a CBC done to follow-up on H&H. B12 levels will need to be followed up upon. 3. Patient will be discharged home with home health physical therapy.   Discharge Diagnoses:  Principal Problem:   Seizures ([place]) Active Problems:   Symptomatic anemia   H/O myomectomy   Weakness generalized   Microcytic anemia   Transaminasemia   Abdominal pain   Iron deficiency anemia   Discharge Condition: Stable and improved  Diet recommendation: Regular  There were no vitals filed for this visit.  History of present illness:  Per Dr Donnajean Lopes is a 26 y.o. female with medical history significant for microcytic anemia, leiomyoma with menorrhagia status post myomectomy on 01/28/2017, and seizures, now presented to [facility] in transfer from [facility] for evaluation of recurrent seizures. Patient has history of seizure disorder and had previously been on antiepileptic, eventually taken off of these remotely. She had a seizure while under general anesthesia in 2016 and was not restarted on suppressive medications as it was triggered by anesthesia. She had remained seizure free until undergoing myomectomy 1 day prior to admission, at the [facility], when she was observed to have generalized convulsive activity which responded to IV benzodiazepines. Neurology consultation was obtained and the patient was to undergo further evaluation with MRI  and EEG. There were plans to discharge the patient home from the [facility] with MRI and EEG to be performed as outpatient, but the patient had an episode just prior to the discharge in which she was found slumped over in an awkward position in her bed, reporting that she had just had a seizure. Case was discussed again with neurology who advised loading the patient with Keppra and transferring her to [facility] for medical observation and neurology consultation.   Hospital Course:  #1 nonepileptic seizures Felt likely induced with general anesthesia. Patient with no focal neurologic deficits except lower extremity weakness. No noted seizures during the hospitalization. EEG ordered however patient stated recently put her weave in a couple days ago and was very expensive and as such refused EEG to be done. MRI of the head was done which was completely normal however patient refused IV contrast for the scan. Patient's electrolytes were repleted.  Patient has been assessed by neurology who feel patient has nonepileptic seizures and some somatization due to nonphysiologic weakness on exam and as such comfortable with deferring EEG. After review of MRI neurology felt no further recommendations to be given at this time except for continued physical therapy. Patient be discharged home in stable and improved condition and is to follow-up with PCP as outpatient.   #2 iron deficiency anemia/microcytic anemia/? Symptomatic anemia/low vitamin B-12 levels Patient with a history of Leomyoma and menorrhagia status post myomectomy on 01/28/2017. Hemoglobin was 10.7 on 01/28/2017 and 7.2 on 01/30/2017. Likely multifactorial secondary to menorrhagia and postop blood loss. Due to patient's complaints of lower extremity weakness and significant drop in hemoglobin patient was transfused 2 units packed red blood cells  with hemoglobin at 11.1 on day of discharge.  Anemia panel consistent with iron deficiency anemia.  Patient also given IV Feraheme 1 and will need to be on oral iron supplementation on discharge.   Anemia panel which was done had a vitamin B 12 of 283, iron level of 16, ferritin of 4, folate of 9.9. Patient was placed on vitamin B-12 IM injections daily for the next 6 days and then will receive vitamin B-12 1000 MCG's IM weekly times one month and then monthly. Outpatient follow-up.  #3 transaminitis Patient with mild elevations in AST and ALT within normal bilirubin with lower abdominal pain. Right upper quadrant abdominal ultrasound unremarkable. LFTs trended down and had normalized by day of discharge. Outpatient follow-up.  #4 Leiomyoma status post myomectomy Patient status post transvaginal myomectomy on 01/28/2017. Patient was maintained on as needed analgesics with stool softeners, laxatives as needed. H&H was 7.2 (01/30/2017) from 10.7 on 01/28/2017. See problem #2. Patient transfused 2 units packed red blood cells with hemoglobin of 11.1 by day of discharge. Outpatient follow-up with OB/GYN.  #5 generalized weakness Patient with complaints of lower extremity weakness and difficulty ambulating. Patient with normal muscle strength and no focal neurological deficits. May be secondary to symptomatic anemia. Patient transfused 2 units packed red blood cells with some improvement with her weakness. Patient was still complaining of weakness and difficulty ambulating. 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 Physical therapy was recommended to assess the patient and recommended home health PT with 24-hour supervision/assistance.  Procedures:  Right upper quadrant abdominal ultrasound 01/30/2017--- negative  MRI brain 01/30/2017 unremarkable   2 units packed red blood cells 01/30/2017  Consultations:  Neurology: Dr.  Nicole Kindred 01/28/2017  Discharge Exam: Vitals:   01/31/17 0513 01/31/17 0949  BP: 110/64 115/69  Pulse: 74 81  Resp: 18 19  Temp: 98.7 F (37.1 C) 98.2 F (36.8 C)    General: NAD Cardiovascular: RRR Respiratory: CTAB  Discharge Instructions   Discharge Instructions    Diet general    Complete by:  As directed    Increase activity slowly    Complete by:  As directed      Current Discharge Medication List    START taking these medications   Details  cyanocobalamin (,VITAMIN B-12,) 1000 MCG/ML injection Inject 1 mL (1,000 mcg total) into the muscle daily. Take 10101mcg daily x 5 days, then 1066mcg weekly x 1 month, then monthly Qty: 10 mL, Refills: 0    iron polysaccharides (NU-IRON) 150 MG capsule Take 1 capsule (150 mg total) by mouth 2 (two) times daily.    senna-docusate (SENOKOT-S) 8.6-50 MG tablet Take 1 tablet by mouth 2 (two) times daily.      CONTINUE these medications which have NOT CHANGED   Details  Ascorbic Acid (VITAMIN C GUMMIE PO) Take 2 tablets by mouth daily.    naproxen (NAPROSYN) 500 MG tablet Take 1 tablet (500 mg total) by mouth 2 (two) times daily with a meal. Qty: 30 tablet, Refills: 0    norgestimate-ethinyl estradiol (ORTHO-CYCLEN, 28,) 0.25-35 MG-MCG tablet Take 4 tablets tonight. 3 tablets x 3 days. 2 tablets x 2 days. 1 tablet once a day until finishing the pack then start a new pack. Qty: 1 Package, Refills: 1    oxyCODONE-acetaminophen (ROXICET) 5-325 MG tablet Take 1 tablet by mouth every 8 (eight) hours. PRN for pain Qty: 5  tablet, Refills: 0       Allergies  Allergen Reactions  . Banana Swelling  . Diprivan [Propofol]     seizure  . Fentanyl     seizures  . Versed [Midazolam]     seizure  . Zofran [Ondansetron Hcl]     seizure  . Lamictal [Lamotrigine] Rash   Follow-up Information    Tyson Dense, MD. Schedule an appointment as soon as possible for a visit in 1 week(s).   Specialty:  Obstetrics and  Gynecology Why:  f/u in 1-2 weeks. Contact information: [address] [place] [address] [phone]        Rachell Cipro, MD. Schedule an appointment as soon as possible for a visit in 2 week(s).   Specialty:  Family Medicine Contact information: [address] [place] [address] [phone]            The results of significant diagnostics from this hospitalization (including imaging, microbiology, ancillary and laboratory) are listed below for reference.    Significant Diagnostic Studies: Mr Brain Wo Contrast  Result Date: 01/30/2017 CLINICAL DATA:  Seizure evaluation EXAM: MRI HEAD WITHOUT CONTRAST TECHNIQUE: Multiplanar, multiecho pulse sequences of the brain and surrounding structures were obtained without intravenous contrast. COMPARISON:  None. FINDINGS: Brain: Image quality degraded by motion. Ventricle size and cerebral volume normal. Negative for acute or chronic infarction. Negative for hemorrhage or mass. Medial temporal lobe normal in signal and morphology. Negative for mesial temporal sclerosis. Pituitary normal in size. The patient refused IV access for contrast infusion. Vascular: Normal flow voids. Skull and upper cervical spine: Negative Sinuses/Orbits: Contracted right maxillary sinus which appears chronic. Normal orbit. Other: None IMPRESSION: Normal MRI of the brain without contrast. Patient refused IV contrast Image quality degraded by motion. Electronically Signed   By: Franchot Gallo M.D.   On: 01/30/2017 15:41   [place] Intraoperative  Result Date: 01/28/2017 CLINICAL DATA:  Ultrasound was provided for use by the ordering physician, and a technical charge was applied by the performing facility.  No radiologist interpretation/professional services rendered.   US Abdomen Limited Ruq  Result Date: 01/30/2017 CLINICAL DATA:  Abdominal pain for 1 day.  Elevated liver enzymes. EXAM: US ABDOMEN LIMITED - RIGHT UPPER QUADRANT COMPARISON:   None. FINDINGS: Gallbladder: No gallstones or wall thickening visualized. No sonographic Murphy sign noted by sonographer. Gallbladder wall thickness is 2 mm, within normal limits. Common bile duct: Diameter: 3.0 mm, within normal limits. Liver: No focal lesion identified. Within normal limits in parenchymal echogenicity. IMPRESSION: Negative right upper quadrant ultrasound. Electronically Signed   By: San Morelle M.D.   On: 01/30/2017 09:38    Microbiology: No results found for this or any previous visit (from the past 240 hour(s)).   Labs: Basic Metabolic Panel:  Recent Labs Lab 01/28/17 1532 01/29/17 2002 01/30/17 0324 01/30/17 0852 01/31/17 0230  NA 137 138 136  --  138  K 3.8 3.3* 3.4*  --  3.5  CL 108 105 104  --  103  CO2 23 28 27   --  27  GLUCOSE 111* 101* 94  --  96  BUN <5* <5* <5*  --  <5*  CREATININE 0.59 0.60 0.54  --  0.70  CALCIUM 8.1* 8.2* 8.2*  --  8.4*  MG  --   --   --  1.8 1.7   Liver Function Tests:  Recent Labs Lab 01/28/17 1532 01/29/17 2002 01/31/17 0230  AST 48* 28 26  ALT 96* 63* 51  ALKPHOS 37* 32* 40  BILITOT 0.5 0.4 0.7  PROT 6.1* 5.3* 6.2*  ALBUMIN 3.6 3.1* 3.3*   No results for input(s): LIPASE, AMYLASE in the last 168 hours. No results for input(s): AMMONIA in the last 168 hours. CBC:  Recent Labs Lab 01/29/17 0651 01/29/17 1115 01/29/17 2048 01/30/17 0324 01/31/17 0230  WBC 13.4* 12.9* 12.1* 11.5* 9.9  HGB 7.7* 7.9* 7.2* 7.2* 11.1*  HCT 22.4* 23.5* 22.3* 22.4* 34.1*  MCV 73.7* 74.4* 75.9* 75.7* 80.0  PLT 435* 441* 395 426* 360   Cardiac Enzymes: No results for input(s): CKTOTAL, CKMB, CKMBINDEX, TROPONINI in the last 168 hours. BNP: BNP (last 3 results) No results for input(s): BNP in the last 8760 hours.  ProBNP (last 3 results) No results for input(s): PROBNP in the last 8760 hours.  CBG:  Recent Labs Lab 01/31/17 0620  GLUCAP 79       Signed:  Analena Gama MD.  [facility] 01/31/2017, 11:00 AM

## 2017-01-31 NOTE — Care Management Note (Signed)
Case Management Note  Patient Details  Name: Audrey Lang MRN: [id number] Date of Birth: [birth year]  Subjective/Objective:                  myomectomy Action/Plan: Discharge planning Expected Discharge Date:  01/30/17               Expected Discharge Plan:  [place]  In-House Referral:     Discharge planning Services  CM Consult  Post Acute Care Choice:  Home Health Choice offered to:  Patient  DME Arranged:  Walker rolling DME Agency:  [place] Arranged:  PT [facility] Agency:  [place]  Status of Service:  Completed, signed off  If discussed at [place] of Stay Meetings, dates discussed:    Additional Comments: CM spoke with pt in room to offer choice of home health agency. Pt chooses [facility] to render HHPT.  Referral called to [facility] rep, Jermaine.  CM notified [place] DME rep, Reggie to please deliver the rolling walker to room prior to discharge.  No other CM needs were communicated.  Dellie Catholic, RN 01/31/2017, 11:50 AM

## 2017-01-31 NOTE — Discharge Instructions (Signed)
Abdominal Pain, Adult Many things can cause belly (abdominal) pain. Most times, belly pain is not dangerous. Many cases of belly pain can be watched and treated at home. Sometimes belly pain is serious, though. Your doctor will try to find the cause of your belly pain. Follow these instructions at home:  Take over-the-counter and prescription medicines only as told by your doctor. Do not take medicines that help you poop (laxatives) unless told to by your doctor.  Drink enough fluid to keep your pee (urine) clear or pale yellow.  Watch your belly pain for any changes.  Keep all follow-up visits as told by your doctor. This is important. Contact a doctor if:  Your belly pain changes or gets worse.  You are not hungry, or you lose weight without trying.  You are having trouble pooping (constipated) or have watery poop (diarrhea) for more than 2-3 days.  You have pain when you pee or poop.  Your belly pain wakes you up at night.  Your pain gets worse with meals, after eating, or with certain foods.  You are throwing up and cannot keep anything down.  You have a fever. Get help right away if:  Your pain does not go away as soon as your doctor says it should.  You cannot stop throwing up.  Your pain is only in areas of your belly, such as the right side or the left lower part of the belly.  You have bloody or black poop, or poop that looks like tar.  You have very bad pain, cramping, or bloating in your belly.  You have signs of not having enough fluid or water in your body (dehydration), such as:  Dark pee, very little pee, or no pee.  Cracked lips.  Dry mouth.  Sunken eyes.  Sleepiness.  Weakness. This information is not intended to replace advice given to you by your health care provider. Make sure you discuss any questions you have with your health care provider. Document Released: 05/11/2008 Document Revised: 06/12/2016 Document Reviewed: 05/06/2016 Elsevier  Interactive Patient Education  2017 [employer]. Anemia, Nonspecific Anemia is a condition in which the concentration of red blood cells or hemoglobin in the blood is below normal. Hemoglobin is a substance in red blood cells that carries oxygen to the tissues of the body. Anemia results in not enough oxygen reaching these tissues. What are the causes? Common causes of anemia include:  Excessive bleeding. Bleeding may be internal or external. This includes excessive bleeding from periods (in women) or from the intestine.  Poor nutrition.  Chronic kidney, thyroid, and liver disease.  Bone marrow disorders that decrease red blood cell production.  Cancer and treatments for cancer.  HIV, AIDS, and their treatments.  Spleen problems that increase red blood cell destruction.  Blood disorders.  Excess destruction of red blood cells due to infection, medicines, and autoimmune disorders. What are the signs or symptoms?  Minor weakness.  Dizziness.  Headache.  Palpitations.  Shortness of breath, especially with exercise.  Paleness.  Cold sensitivity.  Indigestion.  Nausea.  Difficulty sleeping.  Difficulty concentrating. Symptoms may occur suddenly or they may develop slowly. How is this diagnosed? Additional blood tests are often needed. These help your health care provider determine the best treatment. Your health care provider will check your stool for blood and look for other causes of blood loss. How is this treated? Treatment varies depending on the cause of the anemia. Treatment can include:  Supplements of iron,  vitamin [id number], or folic acid.  Hormone medicines.  A blood transfusion. This may be needed if blood loss is severe.  Hospitalization. This may be needed if there is significant continual blood loss.  Dietary changes.  Spleen removal. Follow these instructions at home: Keep all follow-up appointments. It often takes many weeks to correct anemia,  and having your health care provider check on your condition and your response to treatment is very important. Get help right away if:  You develop extreme weakness, shortness of breath, or chest pain.  You become dizzy or have trouble concentrating.  You develop heavy vaginal bleeding.  You develop a rash.  You have bloody or black, tarry stools.  You faint.  You vomit up blood.  You vomit repeatedly.  You have abdominal pain.  You have a fever or persistent symptoms for more than 2-3 days.  You have a fever and your symptoms suddenly get worse.  You are dehydrated. This information is not intended to replace advice given to you by your health care provider. Make sure you discuss any questions you have with your health care provider. Document Released: 12/31/2004 Document Revised: 05/06/2016 Document Reviewed: 05/19/2013 Elsevier Interactive Patient Education  2017 [employer]. Make an appt in clinic in 2 weeks for postoperative visit.

## 2017-03-22 ENCOUNTER — Inpatient Hospital Stay (HOSPITAL_COMMUNITY)
Admission: AD | Admit: 2017-03-22 | Discharge: 2017-03-23 | Disposition: A | Discharge status: Home / Self Care | Payer: BLUE CROSS/BLUE SHIELD | Source: Ambulatory Visit | Attending: Obstetrics and Gynecology | Admitting: Obstetrics and Gynecology

## 2017-03-22 DIAGNOSIS — Z3202 Encounter for pregnancy test, result negative: Secondary | ICD-10-CM | POA: Diagnosis not present

## 2017-03-22 DIAGNOSIS — N939 Abnormal uterine and vaginal bleeding, unspecified: Secondary | ICD-10-CM | POA: Insufficient documentation

## 2017-03-22 LAB — POCT PREGNANCY, URINE: Preg Test, Ur: NEGATIVE

## 2017-03-22 NOTE — MAU Note (Signed)
Pt reports vaginal bleeding that started yesterday, but got much heavier today. States earlier today she was changing her pad every 1-2 hours, but over the last hour or so has changed her pad every 30 mins. Pt also reports some lower abdominal cramping.

## 2017-03-23 DIAGNOSIS — N939 Abnormal uterine and vaginal bleeding, unspecified: Secondary | ICD-10-CM

## 2017-03-23 LAB — URINALYSIS, ROUTINE W REFLEX MICROSCOPIC
Glucose, UA: 100 mg/dL — AB
Ketones, ur: 15 mg/dL — AB
NITRITE: POSITIVE — AB
PH: 5.5 (ref 5.0–8.0)
PROTEIN: 100 mg/dL — AB

## 2017-03-23 LAB — URINALYSIS, MICROSCOPIC (REFLEX)

## 2017-03-23 LAB — WET PREP, GENITAL
Clue Cells Wet Prep HPF POC: NONE SEEN
Sperm: NONE SEEN
TRICH WET PREP: NONE SEEN
YEAST WET PREP: NONE SEEN

## 2017-03-23 LAB — CBC
HCT: 32.6 % — ABNORMAL LOW (ref 36.0–46.0)
Hemoglobin: 10.9 g/dL — ABNORMAL LOW (ref 12.0–15.0)
MCH: 28.9 pg (ref 26.0–34.0)
MCHC: 33.4 g/dL (ref 30.0–36.0)
MCV: 86.5 fL (ref 78.0–100.0)
Platelets: 378 10*3/uL (ref 150–400)
RBC: 3.77 MIL/uL — ABNORMAL LOW (ref 3.87–5.11)
RDW: 17.1 % — AB (ref 11.5–15.5)
WBC: 7 10*3/uL (ref 4.0–10.5)

## 2017-03-23 LAB — GC/CHLAMYDIA PROBE AMP (~~LOC~~) NOT AT ARMC
CHLAMYDIA, DNA PROBE: NEGATIVE
Neisseria Gonorrhea: NEGATIVE

## 2017-03-23 MED ORDER — MEGESTROL ACETATE 40 MG PO TABS: 40.0000 mg | ORAL_TABLET | Freq: Three times a day (TID) | ORAL | 1 refills | Status: DC

## 2017-03-23 NOTE — MAU Provider Note (Signed)
History     CSN: [id number]  Arrival date and time: 03/22/17 2324   First Provider Initiated Contact with Patient 03/23/17 0013      Chief Complaint  Patient presents with  . Vaginal Bleeding   Audrey Lang is a 26 y.o. G1P0010 who presents today with vaginal bleeding. This has been an ongoing issue for her. She had a myomectomy 01/2017, and she states that the fibroid "grew back within 2 weeks". She states that she has an appointment with Dr. Royston Sinner on May and is scheduled for hysteroscopy on May 7 at [facility]. She states that she has not had a period since November, and that none of the bleeding episodes since then are periods.    Vaginal Bleeding  The patient's primary symptoms include vaginal bleeding. This is a recurrent problem. The current episode started yesterday. The problem occurs constantly. The problem has been gradually worsening. Pain severity now: 5/10. The problem affects both sides. She is not pregnant. The vaginal discharge was bloody. The vaginal bleeding is heavier than menses (6:00 pm started soaking one pad per hour until arrival here. ). She has been passing clots. She has not been passing tissue. Nothing aggravates the symptoms. Treatments tried: megace, sprintec taken today as prescribed.  The treatment provided no relief. Her menstrual history has been irregular (Patient states that her LMP was in November 2017, but she has had several instances of bleeding since then. She states "those are not periods". ).    Past Medical History:  Diagnosis Date  . Anemia   . Asthma   . Complication of anesthesia    had 3 seizures after anesthesia, severe skin rash  . Dyspnea    anemia  . Fibroid   . GERD (gastroesophageal reflux disease)   . History of blood transfusion    01/2017  . Seizures ([place]) 2009   last seizure 2011    Past Surgical History:  Procedure Laterality Date  . DILATATION & CURRETTAGE/HYSTEROSCOPY WITH RESECTOCOPE N/A 01/28/2017   Procedure:  DILATATION & CURETTAGE/HYSTEROSCOPY WITH MYOSURE;  Surgeon: Tyson Dense, MD;  Location: [facility];  Service: Gynecology;  Laterality: N/A;  resection of fibriod   . DILATION AND EVACUATION N/A 01/28/2017   Procedure: ULTRASOUND GUIDED DILATATION AND EVACUATION;  Surgeon: Tyson Dense, MD;  Location: [facility];  Service: Gynecology;  Laterality: N/A;  . HYSTEROSCOPY N/A 01/28/2017   Procedure: HYSTEROSCOPY WITH INSERTION OF FOLEY CATHETER BALLOON IN UTERUS;  Surgeon: Tyson Dense, MD;  Location: [facility];  Service: Gynecology;  Laterality: N/A;  . MYOMECTOMY     in [place], no fibroids removed  . TONSILLECTOMY  2003    Family History  Problem Relation Age of Onset  . Ulcers Mother   . Asthma Sister     Social History  Substance Use Topics  . Smoking status: Never Smoker  . Smokeless tobacco: Never Used  . Alcohol use 0.5 oz/week    1 Standard drinks or equivalent per week     Comment: social    Allergies:  Allergies  Allergen Reactions  . Banana Swelling  . Diprivan [Propofol]     seizure  . Fentanyl     seizures  . Versed [Midazolam]     seizure  . Zofran [Ondansetron Hcl]     seizure  . Lamictal [Lamotrigine] Rash    Prescriptions Prior to Admission  Medication Sig Dispense Refill Last Dose  . Ascorbic Acid (VITAMIN C GUMMIE PO) Take 2 tablets  by mouth daily.   Past Week at Unknown time  . cyanocobalamin (,VITAMIN B-12,) 1000 MCG/ML injection Inject 1 mL (1,000 mcg total) into the muscle daily. Take 1064mcg daily x 5 days, then 1028mcg weekly x 1 month, then monthly 10 mL 0   . iron polysaccharides (NU-IRON) 150 MG capsule Take 1 capsule (150 mg total) by mouth 2 (two) times daily.     . naproxen (NAPROSYN) 500 MG tablet Take 1 tablet (500 mg total) by mouth 2 (two) times daily with a meal. (Patient taking differently: Take 500 mg by mouth 2 (two) times daily as needed (for pain.). ) 30 tablet 0 01/27/2017 at Unknown time  . norgestimate-ethinyl  estradiol (ORTHO-CYCLEN, 28,) 0.25-35 MG-MCG tablet Take 4 tablets tonight. 3 tablets x 3 days. 2 tablets x 2 days. 1 tablet once a day until finishing the pack then start a new pack. (Patient taking differently: Take 1-4 tablets by mouth See admin instructions. Take 4 tablets tonight. 3 tablets x 3 days. 2 tablets x 2 days. 1 tablet once a day until finishing the pack then start a new pack.) 1 Package 1 01/27/2017 at Unknown time  . oxyCODONE-acetaminophen (ROXICET) 5-325 MG tablet Take 1 tablet by mouth every 8 (eight) hours. PRN for pain (Patient taking differently: Take 1 tablet by mouth every 8 (eight) hours as needed (for pain.). ) 5 tablet 0 Unknown at Unknown time  . senna-docusate (SENOKOT-S) 8.6-50 MG tablet Take 1 tablet by mouth 2 (two) times daily.       Review of Systems  Genitourinary: Positive for vaginal bleeding.   Physical Exam   Blood pressure 118/87, pulse 82, temperature 98.5 F (36.9 C), temperature source Oral, resp. rate 18, height 5' 0.5" (1.537 m), weight 138 lb (62.6 kg), last menstrual period 11/03/2016.  Physical Exam  Nursing note and vitals reviewed. Constitutional: She is oriented to person, place, and time. She appears well-developed and well-nourished. No distress.  HENT:  Head: Normocephalic.  Cardiovascular: Normal rate.   Respiratory: Effort normal.  GI: Soft. There is no tenderness. There is no rebound.  Genitourinary:  Genitourinary Comments:  External: no lesion Vagina: scant amount of blood seen  Cervix: pink, smooth, no CMT Uterus: NSSC Adnexa: NT   Neurological: She is alert and oriented to person, place, and time.  Skin: Skin is warm and dry.  Psychiatric: She has a normal mood and affect.   Results for orders placed or performed during the hospital encounter of 03/22/17 (from the past 24 hour(s))  Urinalysis, Routine w reflex microscopic     Status: Abnormal   Collection Time: 03/22/17 11:30 PM  Result Value Ref Range   Color, Urine  RED (A) YELLOW   APPearance CLOUDY (A) CLEAR   Specific Gravity, Urine >1.030 (H) 1.005 - 1.030   pH 5.5 5.0 - 8.0   Glucose, UA 100 (A) NEGATIVE mg/dL   Hgb urine dipstick LARGE (A) NEGATIVE   Bilirubin Urine SMALL (A) NEGATIVE   Ketones, ur 15 (A) NEGATIVE mg/dL   Protein, ur 100 (A) NEGATIVE mg/dL   Nitrite POSITIVE (A) NEGATIVE   Leukocytes, UA TRACE (A) NEGATIVE  Urinalysis, Microscopic (reflex)     Status: Abnormal   Collection Time: 03/22/17 11:30 PM  Result Value Ref Range   RBC / HPF TOO NUMEROUS TO COUNT 0 - 5 RBC/hpf   WBC, UA TOO NUMEROUS TO COUNT 0 - 5 WBC/hpf   Bacteria, UA MANY (A) NONE SEEN   Squamous Epithelial / LPF  0-5 (A) NONE SEEN   Urine-Other MUCOUS PRESENT   Pregnancy, urine POC     Status: None   Collection Time: 03/22/17 11:40 PM  Result Value Ref Range   Preg Test, Ur NEGATIVE NEGATIVE  Wet prep, genital     Status: Abnormal   Collection Time: 03/23/17 12:20 AM  Result Value Ref Range   Yeast Wet Prep HPF POC NONE SEEN NONE SEEN   Trich, Wet Prep NONE SEEN NONE SEEN   Clue Cells Wet Prep HPF POC NONE SEEN NONE SEEN   WBC, Wet Prep HPF POC FEW (A) NONE SEEN   Sperm NONE SEEN   CBC     Status: Abnormal   Collection Time: 03/23/17 12:25 AM  Result Value Ref Range   WBC 7.0 4.0 - 10.5 K/uL   RBC 3.77 (L) 3.87 - 5.11 MIL/uL   Hemoglobin 10.9 (L) 12.0 - 15.0 g/dL   HCT 32.6 (L) 36.0 - 46.0 %   MCV 86.5 78.0 - 100.0 fL   MCH 28.9 26.0 - 34.0 pg   MCHC 33.4 30.0 - 36.0 g/dL   RDW 17.1 (H) 11.5 - 15.5 %   Platelets 378 150 - 400 K/uL    MAU Course  Procedures  MDM 0106: D/W Dr. Matthew Saras, patient is ok for DC at this time. Plan to monitor bleeding, and call the office for an appointment to see Dr. Royston Sinner sooner if needed.   Assessment and Plan   1. Abnormal uterine bleeding    DC home Comfort measures reviewed  Bleeding precautions RX: none, continue all meds as prescribed  Return to MAU as needed FU with OB as planned  Follow-up  Information    Tyson Dense, MD Follow up.   Specialty:  Obstetrics and Gynecology Why:  call the office if bleeding to continues to be seen sooner than your may 1 appointment if needed  Contact information: [address] [place] [address] [phone]            Mathis Bud 03/23/2017, 12:14 AM

## 2017-03-23 NOTE — Discharge Instructions (Signed)

## 2017-03-24 LAB — URINE CULTURE: CULTURE: NO GROWTH

## 2017-06-24 ENCOUNTER — Telehealth: Payer: Self-pay | Admitting: Hematology and Oncology

## 2017-06-24 ENCOUNTER — Encounter: Payer: Self-pay | Admitting: Hematology and Oncology

## 2017-06-24 NOTE — Telephone Encounter (Signed)
Appt scheduled for the pt to see Dr. Lebron Conners on 7/20 at 2pm. Pt aware to arrive 30 minutes. Voiced understanding and location given.

## 2017-06-25 ENCOUNTER — Encounter: Payer: BLUE CROSS/BLUE SHIELD | Admitting: Hematology and Oncology

## 2017-06-30 ENCOUNTER — Ambulatory Visit (HOSPITAL_BASED_OUTPATIENT_CLINIC_OR_DEPARTMENT_OTHER): Payer: BLUE CROSS/BLUE SHIELD | Admitting: Hematology and Oncology

## 2017-06-30 ENCOUNTER — Ambulatory Visit (HOSPITAL_BASED_OUTPATIENT_CLINIC_OR_DEPARTMENT_OTHER): Payer: BLUE CROSS/BLUE SHIELD

## 2017-06-30 ENCOUNTER — Telehealth: Payer: Self-pay

## 2017-06-30 ENCOUNTER — Encounter: Payer: Self-pay | Admitting: Hematology and Oncology

## 2017-06-30 VITALS — BP 134/81 | HR 98 | Temp 98.6°F | Resp 18 | Ht 65.0 in | Wt 138.8 lb

## 2017-06-30 DIAGNOSIS — D500 Iron deficiency anemia secondary to blood loss (chronic): Secondary | ICD-10-CM | POA: Diagnosis not present

## 2017-06-30 DIAGNOSIS — R7989 Other specified abnormal findings of blood chemistry: Secondary | ICD-10-CM | POA: Diagnosis not present

## 2017-06-30 DIAGNOSIS — D509 Iron deficiency anemia, unspecified: Secondary | ICD-10-CM

## 2017-06-30 DIAGNOSIS — R5383 Other fatigue: Secondary | ICD-10-CM

## 2017-06-30 DIAGNOSIS — D649 Anemia, unspecified: Secondary | ICD-10-CM

## 2017-06-30 DIAGNOSIS — N920 Excessive and frequent menstruation with regular cycle: Secondary | ICD-10-CM

## 2017-06-30 LAB — LACTATE DEHYDROGENASE: LDH: 189 U/L (ref 125–245)

## 2017-06-30 LAB — CBC & DIFF AND RETIC
BASO%: 0.6 % (ref 0.0–2.0)
Basophils Absolute: 0 10*3/uL (ref 0.0–0.1)
EOS%: 2 % (ref 0.0–7.0)
Eosinophils Absolute: 0.1 10*3/uL (ref 0.0–0.5)
HEMATOCRIT: 31.9 % — AB (ref 34.8–46.6)
HGB: 9.6 g/dL — ABNORMAL LOW (ref 11.6–15.9)
IMMATURE RETIC FRACT: 20.1 % — AB (ref 1.60–10.00)
LYMPH#: 2.3 10*3/uL (ref 0.9–3.3)
LYMPH%: 45.9 % (ref 14.0–49.7)
MCH: 21.3 pg — ABNORMAL LOW (ref 25.1–34.0)
MCHC: 30.1 g/dL — ABNORMAL LOW (ref 31.5–36.0)
MCV: 70.9 fL — ABNORMAL LOW (ref 79.5–101.0)
MONO#: 0.5 10*3/uL (ref 0.1–0.9)
MONO%: 9.8 % (ref 0.0–14.0)
NEUT#: 2.1 10*3/uL (ref 1.5–6.5)
NEUT%: 41.7 % (ref 38.4–76.8)
PLATELETS: 211 10*3/uL (ref 145–400)
RBC: 4.5 10*6/uL (ref 3.70–5.45)
RDW: 18.4 % — ABNORMAL HIGH (ref 11.2–14.5)
RETIC CT ABS: 35.55 10*3/uL (ref 33.70–90.70)
Retic %: 0.79 % (ref 0.70–2.10)
WBC: 5 10*3/uL (ref 3.9–10.3)

## 2017-06-30 NOTE — Assessment & Plan Note (Signed)
26 year old female with history of severe and bleeding due to a later myomas, presenting with microcytic hypochromic chronic anemia with severe exacerbations due to recurrent bleed. Anemia has all the hallmarks of iron deficiency including microcytosis, hypochromia, and pica accompanying the presentation with patient craving ice.  In the past, patient required blood transfusions to control anemia. Based on her verbal account, she has only been treated with daily over-the-counter iron supplement without significant hemoglobin response. Not surprising as though supplements rarely have sufficient amount of iron especially when administered only daily.  At this time, I suggest for reassessment of her anemia including a reevaluation of the iron panel. I also would like to obtain thyroid function and B12 and folate levels as previously they were somewhat on the low side and the patient is likely to have increased requirement for both for recovery erythropoiesis.  Plan: --Labs today --Will likely start on iron sulfate 325 mg once to 3 times per day once the labs resulted. Supplement B12 and folate as well. If patient shows no response within --We'll consider doses of parenteral iron if ferritin is still in single digits. --RTC in one week for lab review  Voice recognition software was used and creation of this note. Despite my best effort at editing the text, some misspelling/errors may have occurred.

## 2017-06-30 NOTE — Progress Notes (Signed)
[facility] New Visit:  Assessment: Iron deficiency anemia 26 year old female with history of severe and bleeding due to a later myomas, presenting with microcytic hypochromic chronic anemia with severe exacerbations due to recurrent bleed. Anemia has all the hallmarks of iron deficiency including microcytosis, hypochromia, and pica accompanying the presentation with patient craving ice.  In the past, patient required blood transfusions to control anemia. Based on her verbal account, she has only been treated with daily over-the-counter iron supplement without significant hemoglobin response. Not surprising as though supplements rarely have sufficient amount of iron especially when administered only daily.  At this time, I suggest for reassessment of her anemia including a reevaluation of the iron panel. I also would like to obtain thyroid function and B12 and folate levels as previously they were somewhat on the low side and the patient is likely to have increased requirement for both for recovery erythropoiesis.  Plan: --Labs today --Will likely start on iron sulfate 325 mg once to 3 times per day once the labs resulted. Supplement B12 and folate as well. If patient shows no response within --We'll consider doses of parenteral iron if ferritin is still in single digits. --RTC in one week for lab review  Voice recognition software was used and creation of this note. Despite my best effort at editing the text, some misspelling/errors may have occurred.    Orders Placed This Encounter  Procedures  . CBC & Diff and Retic    Standing Status:   Future    Number of Occurrences:   1    Standing Expiration Date:   06/30/2018  . Ferritin    Standing Status:   Future    Number of Occurrences:   1    Standing Expiration Date:   06/30/2018  . Iron and TIBC    Standing Status:   Future    Number of Occurrences:   1    Standing Expiration Date:   06/30/2018  . Folate, Serum     Standing Status:   Future    Number of Occurrences:   1    Standing Expiration Date:   06/30/2018  . Vitamin B12    Standing Status:   Future    Number of Occurrences:   1    Standing Expiration Date:   06/30/2018  . Lactate dehydrogenase (LDH)    Standing Status:   Future    Number of Occurrences:   1    Standing Expiration Date:   06/30/2018  . Haptoglobin    Standing Status:   Future    Number of Occurrences:   1    Standing Expiration Date:   06/30/2018  . Methylmalonic acid, serum    Standing Status:   Future    Number of Occurrences:   1    Standing Expiration Date:   06/30/2018  . TSH    Standing Status:   Future    Number of Occurrences:   1    Standing Expiration Date:   06/30/2018  . Direct antiglobulin test ([facility])    Standing Status:   Future    Number of Occurrences:   1    Standing Expiration Date:   06/30/2018    All questions were answered.  . The patient knows to call the clinic with any problems, questions or concerns.  This note was electronically signed.    History of Presenting Illness Audrey Lang 26 y.o. presenting to the [facility] for Evaluation of iron  deficiency anemia. 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 Previously, had heavy menstrual periods which have resulted in significant anemia. Patient has been treated with oral iron supplementation with over-the-counter supplement given once daily, but patient doesn't remember the dose or the type of the supplement. With that, her iron failed to respond to treatment and she was referred to our clinic for additional evaluation and possible initiation of parenteral iron therapy. These see hematological history below for the trends of hemoglobin.  Oncological/hematological History: --Labs, 05/08/15: Hgb 11.9, MCV 85.7, MCH 29.3, RDW 13.9;   --Labs, 11/07/16: Hgb 8.3, MCV 70.6, MCH 22.6, RDW 18.8; --Labs, 01/13/17: Hgb  7.5, MCV 70.2, MCH 22.8, RDW 21.1;  --Labs, 01/20/17: Hgb 10.4, MCV 76.6, MCH 24.9, RDW 21.4;  --Labs, 01/30/17: Hgb 7.2, MCV 75.7, MCH 24.3, RDW 20.0; Fe 16, FeSat 4%, TIBC 416, Ferritin 4, Folate 9.9, Vit B12 283;  --Labs, 03/23/17: Hgb 10.9, MCV 86.5, MCH 28.9, RDW 17.1;  --Labs, 06/02/17: Hgb 7.8  Treatment Hx: --Blood Tx:  --2U pRBC, 01/30/17 --PO Fe supplementation: ???   Medical History: Past Medical History:  Diagnosis Date  . Anemia   . Asthma   . Complication of anesthesia    had 3 seizures after anesthesia, severe skin rash  . Dyspnea    anemia  . Fibroid   . GERD (gastroesophageal reflux disease)   . History of blood transfusion    01/2017  . Seizures ([place]) 2009   last seizure 2011    Surgical History: Past Surgical History:  Procedure Laterality Date  . DILATATION & CURRETTAGE/HYSTEROSCOPY WITH RESECTOCOPE N/A 01/28/2017   Procedure: DILATATION & CURETTAGE/HYSTEROSCOPY WITH MYOSURE;  Surgeon: Tyson Dense, MD;  Location: [facility];  Service: Gynecology;  Laterality: N/A;  resection of fibriod   . DILATION AND EVACUATION N/A 01/28/2017   Procedure: ULTRASOUND GUIDED DILATATION AND EVACUATION;  Surgeon: Tyson Dense, MD;  Location: [facility];  Service: Gynecology;  Laterality: N/A;  . HYSTEROSCOPY N/A 01/28/2017   Procedure: HYSTEROSCOPY WITH INSERTION OF FOLEY CATHETER BALLOON IN UTERUS;  Surgeon: Tyson Dense, MD;  Location: [facility];  Service: Gynecology;  Laterality: N/A;  . MYOMECTOMY     in [place], no fibroids removed  . TONSILLECTOMY  2003    Family History: Family History  Problem Relation Age of Onset  . Ulcers Mother   . Diabetes Mother   . Asthma Sister   . Diabetes Maternal Grandmother     Social History: Social History   Social History  . Marital status: Single    Spouse name: n/a  . Number of children: 0  . Years of education: N/A   Occupational History  . Student     [employer]    Social History Main Topics  . Smoking status: Never Smoker  . Smokeless tobacco: Never Used  . Alcohol use 0.5 oz/week    1 Standard drinks or equivalent per week     Comment: social  . Drug use: No  . Sexual activity: Yes    Partners: Male    Birth control/ protection: Condom   Other Topics Concern  . Not on file   Social History Narrative   Boyfriend lives in [place].    Allergies: Allergies  Allergen Reactions  . Banana Swelling  . Dilaudid [Hydromorphone Hcl] Other (See Comments)    Seizures.  . Lidocaine Other (See Comments)  Seizures.  . Diprivan [Propofol]     seizure  . Fentanyl     seizures  . Versed [Midazolam]     seizure  . Zofran [Ondansetron Hcl]     seizure  . Lamictal [Lamotrigine] Rash    Medications:  No current outpatient prescriptions on file.   No current facility-administered medications for this visit.     Review of Systems: Review of Systems  Constitutional: Positive for appetite change and fatigue. Negative for chills, diaphoresis, fever and unexpected weight change.       Patient reports ice cravings.  HENT:   Negative for hearing loss, lump/mass, mouth sores, nosebleeds, sore throat, tinnitus, trouble swallowing and voice change.   Eyes: Negative for icterus.  Respiratory: Positive for shortness of breath. Negative for chest tightness, cough, hemoptysis and wheezing.        Dyspnea on exertion when climbing stairs.  Cardiovascular: Negative for chest pain, leg swelling and palpitations.  Gastrointestinal: Negative for abdominal distention, abdominal pain, blood in stool, constipation, diarrhea, nausea, rectal pain and vomiting.  Endocrine: Negative for hot flashes.  Genitourinary: Positive for menstrual problem. Negative for difficulty urinating, dysuria, frequency and hematuria.        Previous troubles with vaginal bleeding have resolved.  Musculoskeletal: Negative for arthralgias, back pain, flank pain, myalgias, neck pain  and neck stiffness.  Skin: Negative for rash.  Neurological: Negative.   Hematological: Negative.   Psychiatric/Behavioral: Negative.   All other systems reviewed and are negative.    PHYSICAL EXAMINATION Blood pressure 134/81, pulse 98, temperature 98.6 F (37 C), temperature source Oral, resp. rate 18, height 5\' 5"  (1.651 m), weight 138 lb 12.8 oz (63 kg), SpO2 100 %.  ECOG PERFORMANCE STATUS: 1 - Symptomatic but completely ambulatory  Physical Exam  Constitutional: She is oriented to person, place, and time and well-developed, well-nourished, and in no distress. No distress.  HENT:  Head: Normocephalic and atraumatic.  Mouth/Throat: Oropharynx is clear and moist. No oropharyngeal exudate.  Eyes: Pupils are equal, round, and reactive to light. Conjunctivae and EOM are normal. No scleral icterus.  Neck: Normal range of motion. Neck supple. No tracheal deviation present. No thyromegaly present.  Cardiovascular: Normal rate, regular rhythm and normal heart sounds.  Exam reveals no gallop.   No murmur heard. Pulmonary/Chest: Effort normal and breath sounds normal. No respiratory distress. She has no wheezes. She has no rales.  Abdominal: Soft. She exhibits no distension and no mass. There is no tenderness.  Musculoskeletal: She exhibits no edema.  Lymphadenopathy:    She has no cervical adenopathy.  Neurological: She is alert and oriented to person, place, and time. She has normal reflexes. No cranial nerve deficit. She exhibits normal muscle tone.  Skin: Skin is warm. No rash noted. She is not diaphoretic. No erythema. There is pallor.     LABORATORY DATA: I have personally reviewed the data as listed: Appointment on 06/30/2017  Component Date Value Ref Range Status  . WBC 06/30/2017 5.0  3.9 - 10.3 10e3/uL Final  . NEUT# 06/30/2017 2.1  1.5 - 6.5 10e3/uL Final  . HGB 06/30/2017 9.6* 11.6 - 15.9 g/dL Final  . HCT 06/30/2017 31.9* 34.8 - 46.6 % Final  . Platelets 06/30/2017 211   145 - 400 10e3/uL Final  . MCV 06/30/2017 70.9* 79.5 - 101.0 fL Final  . MCH 06/30/2017 21.3* 25.1 - 34.0 pg Final  . MCHC 06/30/2017 30.1* 31.5 - 36.0 g/dL Final  . RBC 06/30/2017 4.50  3.70 -  5.45 10e6/uL Final  . RDW 06/30/2017 18.4* 11.2 - 14.5 % Final  . lymph# 06/30/2017 2.3  0.9 - 3.3 10e3/uL Final  . MONO# 06/30/2017 0.5  0.1 - 0.9 10e3/uL Final  . Eosinophils Absolute 06/30/2017 0.1  0.0 - 0.5 10e3/uL Final  . Basophils Absolute 06/30/2017 0.0  0.0 - 0.1 10e3/uL Final  . NEUT% 06/30/2017 41.7  38.4 - 76.8 % Final  . LYMPH% 06/30/2017 45.9  14.0 - 49.7 % Final  . MONO% 06/30/2017 9.8  0.0 - 14.0 % Final  . EOS% 06/30/2017 2.0  0.0 - 7.0 % Final  . BASO% 06/30/2017 0.6  0.0 - 2.0 % Final  . Retic % 06/30/2017 0.79  0.70 - 2.10 % Final  . Retic Ct Abs 06/30/2017 35.55  33.70 - 90.70 10e3/uL Final  . Immature Retic Fract 06/30/2017 20.10* 1.60 - 10.00 % Final  . LDH 06/30/2017 189  125 - 245 U/L Final        Ardath Sax, MD

## 2017-06-30 NOTE — Telephone Encounter (Signed)
appts made and patient sent back to the lab today,avs printed   Audrey Lang

## 2017-07-01 LAB — TSH: TSH: 0.699 m(IU)/L (ref 0.308–3.960)

## 2017-07-01 LAB — IRON AND TIBC
%SAT: 5 % — AB (ref 21–57)
IRON: 24 ug/dL — AB (ref 41–142)
TIBC: 466 ug/dL — ABNORMAL HIGH (ref 236–444)
UIBC: 442 ug/dL — ABNORMAL HIGH (ref 120–384)

## 2017-07-01 LAB — FOLATE: FOLATE: 11.8 ng/mL (ref >3.0)

## 2017-07-01 LAB — HAPTOGLOBIN: Haptoglobin: 103 mg/dL (ref 34–200)

## 2017-07-01 LAB — VITAMIN B12: VITAMIN B 12: 449 pg/mL (ref 232–1245)

## 2017-07-01 LAB — DIRECT ANTIGLOBULIN TEST (NOT AT ARMC): COOMBS', DIRECT: NEGATIVE

## 2017-07-01 LAB — FERRITIN: Ferritin: 4 ng/ml — ABNORMAL LOW (ref 9–269)

## 2017-07-04 LAB — METHYLMALONIC ACID, SERUM: Methylmalonic Acid, Serum: 62 nmol/L (ref 0–378)

## 2017-07-07 ENCOUNTER — Ambulatory Visit: Payer: BLUE CROSS/BLUE SHIELD | Admitting: Hematology and Oncology

## 2017-07-08 ENCOUNTER — Telehealth: Payer: Self-pay

## 2017-07-08 NOTE — Telephone Encounter (Signed)
Pt missed 1320 f/u with Dr. Lebron Conners yesterday. Called with [place] number to let us know if she wishes to reschedule. Pt needs 63min appt with physician.

## 2017-07-09 ENCOUNTER — Other Ambulatory Visit: Payer: Self-pay

## 2017-07-09 ENCOUNTER — Telehealth: Payer: Self-pay

## 2017-07-09 NOTE — Telephone Encounter (Signed)
Left message on pt phone. Per labs, Dr. Lebron Conners notes that she has iron deficiency anemia. Low iron causing pt to be anemic at times. Recommended treatment, increase frequency of ferrous sulfate (already prescribed) to three times daily. Pt to call back with questions and once insurance settled, for a 1 month f/u with labs (CBC, CMET, and iron).

## 2017-12-13 ENCOUNTER — Encounter (HOSPITAL_COMMUNITY): Payer: Self-pay | Admitting: Emergency Medicine

## 2017-12-13 ENCOUNTER — Emergency Department (HOSPITAL_COMMUNITY)
Admission: EM | Admit: 2017-12-13 | Discharge: 2017-12-13 | Disposition: A | Discharge status: Home / Self Care | Payer: BLUE CROSS/BLUE SHIELD | Attending: Emergency Medicine | Admitting: Emergency Medicine

## 2017-12-13 DIAGNOSIS — H10021 Other mucopurulent conjunctivitis, right eye: Secondary | ICD-10-CM | POA: Insufficient documentation

## 2017-12-13 DIAGNOSIS — H109 Unspecified conjunctivitis: Secondary | ICD-10-CM

## 2017-12-13 MED ORDER — FLUORESCEIN SODIUM 1 MG OP STRP
1.0000 | ORAL_STRIP | Freq: Once | OPHTHALMIC | Status: AC
  Administered 2017-12-13: 1 via OPHTHALMIC
  Filled 2017-12-13: qty 1

## 2017-12-13 MED ORDER — ERYTHROMYCIN 5 MG/GM OP OINT
TOPICAL_OINTMENT | Freq: Once | OPHTHALMIC | Status: AC
  Administered 2017-12-13: 1 via OPHTHALMIC
  Filled 2017-12-13: qty 3.5

## 2017-12-13 MED ORDER — TETRACAINE HCL 0.5 % OP SOLN
1.0000 [drp] | Freq: Once | OPHTHALMIC | Status: AC
  Administered 2017-12-13: 1 [drp] via OPHTHALMIC
  Filled 2017-12-13: qty 4

## 2017-12-13 NOTE — ED Notes (Signed)
Visual acuity screening- patient states she wears contact lenses usually, but does not have any lenses in at this time: Right eye: 20/100 Left eye: 20/70 Bilateral eyes: 20/70

## 2017-12-13 NOTE — ED Provider Notes (Signed)
[facility] DEPT Provider Note   CSN: [id number] Arrival date & time: 12/13/17  1218     History   Chief Complaint Chief Complaint  Patient presents with  . Eye Drainage    HPI Audrey Lang is a 27 y.o. female who presents to the ED with right eye redness and drainage that started 5 days ago and has gotten worse. Over the past 2 days patient has noted redness. 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 Patient is a contact lens wearer but denies any injury or scratch with the lens. She denies recent pool or hot tub exposure.   HPI  Past Medical History:  Diagnosis Date  . Anemia   . Asthma   . Complication of anesthesia    had 3 seizures after anesthesia, severe skin rash  . Dyspnea    anemia  . Fibroid   . GERD (gastroesophageal reflux disease)   . History of blood transfusion    01/2017  . Seizures ([place]) 2009   last seizure 2011    Patient Active Problem List   Diagnosis Date Noted  . Iron deficiency anemia 01/30/2017  . Symptomatic anemia 01/30/2017  . Weakness generalized 01/29/2017  . Microcytic anemia 01/29/2017  . Transaminasemia 01/29/2017  . Abdominal pain   . H/O myomectomy 01/28/2017  . Leiomyoma 01/13/2017  . Seizures ([place])     Past Surgical History:  Procedure Laterality Date  . DILATATION & CURRETTAGE/HYSTEROSCOPY WITH RESECTOCOPE N/A 01/28/2017   Procedure: DILATATION & CURETTAGE/HYSTEROSCOPY WITH MYOSURE;  Surgeon: Tyson Dense, MD;  Location: [facility];  Service: Gynecology;  Laterality: N/A;  resection of fibriod   . DILATION AND EVACUATION N/A 01/28/2017   Procedure: ULTRASOUND GUIDED DILATATION AND EVACUATION;  Surgeon: Tyson Dense, MD;  Location: [facility];  Service: Gynecology;  Laterality: N/A;  . HYSTEROSCOPY N/A 01/28/2017   Procedure: HYSTEROSCOPY WITH INSERTION OF FOLEY CATHETER BALLOON IN UTERUS;  Surgeon: Tyson Dense, MD;  Location: [facility];  Service:  Gynecology;  Laterality: N/A;  . MYOMECTOMY     in [place], no fibroids removed  . TONSILLECTOMY  2003    OB History    Gravida Para Term Preterm AB Living   1 0 0 0 1 0   SAB TAB Ectopic Multiple Live Births   1 0 0 0         Home Medications    Prior to Admission medications   Not on File    Family History Family History  Problem Relation Age of Onset  . Ulcers Mother   . Diabetes Mother   . Asthma Sister   . Diabetes Maternal Grandmother     Social History Social History   Tobacco Use  . Smoking status: Never Smoker  . Smokeless tobacco: Never Used  Substance Use Topics  . Alcohol use: Yes    Alcohol/week: 0.5 oz    Types: 1 Standard drinks or equivalent per week    Comment: social  . Drug use: No     Allergies   Banana; Dilaudid [hydromorphone hcl]; Lamictal [lamotrigine]; Lidocaine; Diprivan [propofol]; Fentanyl; Versed [midazolam]; and Zofran [ondansetron hcl]   Review of Systems Review of Systems  Eyes: Positive for photophobia, discharge, redness, itching and visual disturbance.  All other systems reviewed and are negative.    Physical Exam Updated Vital Signs BP 123/84 (BP Location: Left Arm)   Pulse 93   Temp 98.3 F (36.8 C) (Oral)  Resp 14   Wt 61.9 kg (136 lb 8 oz)   LMP 11/28/2017   SpO2 97%   BMI 22.71 kg/m   Physical Exam  Constitutional: She appears well-developed and well-nourished. No distress.  HENT:  Head: Normocephalic.  Eyes: EOM are normal. Pupils are equal, round, and reactive to light. Right eye exhibits discharge. Right eye exhibits no hordeolum. No foreign body present in the right eye. Right conjunctiva is injected.  Fundoscopic exam:      The right eye shows exudate.  Slit lamp exam:      The right eye shows no corneal abrasion, no corneal ulcer, no foreign body and no fluorescein uptake.  Neck: Neck supple.  Cardiovascular: Normal rate.  Pulmonary/Chest: Effort normal.  Musculoskeletal: Normal range of  motion.  Neurological: She is alert.  Skin: Skin is warm and dry.  Psychiatric: She has a normal mood and affect.  Nursing note and vitals reviewed.    ED Treatments / Results  Labs (all labs ordered are listed, but only abnormal results are displayed) Labs Reviewed - No data to display  Radiology No results found.  Procedures Procedures (including critical care time)  Medications Ordered in ED Medications  tetracaine (PONTOCAINE) 0.5 % ophthalmic solution 1 drop (not administered)  fluorescein ophthalmic strip 1 strip (not administered)  erythromycin ophthalmic ointment (not administered)     Initial Impression / Assessment and Plan / ED Course  I have reviewed the triage vital signs and the nursing notes. [name] presents with symptoms consistent with bacterial conjunctivitis.  Purulent discharge exam.  No corneal abrasions, entrapment, consensual photophobia, or dendritic staining with fluorescein study.  Presentation non-concerning for iritis, corneal abrasions, or HSV.  No evidence of preseptal or orbital cellulitis.  Pt is a contact lens wearer. She has been instructed no to wear the contacts until this infection resolves.  Patient will be given erythromycin ophthalmic.  Personal hygiene and frequent handwashing discussed.  Patient advised to followup with ophthalmologist for reevaluation.  Patient verbalizes understanding and is agreeable with discharge.     Final Clinical Impressions(s) / ED Diagnoses   Final diagnoses:  Bacterial conjunctivitis of right eye    ED Discharge Orders    None       Debroah Baller [place], [place] 12/13/17 1445    Valarie Merino, MD 12/13/17 [phone]

## 2017-12-13 NOTE — ED Triage Notes (Signed)
Patient presents with right eye redness and swelling onset of Thursday. Started out with just eye soreness and redness began yesterday. 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

## 2017-12-13 NOTE — Discharge Instructions (Signed)
Follow up with your eye doctor in the next few days for recheck  Use the eye ointment 3 times a day. Do not wear contact lenses.

## 2017-12-15 ENCOUNTER — Encounter (HOSPITAL_BASED_OUTPATIENT_CLINIC_OR_DEPARTMENT_OTHER): Payer: Self-pay

## 2017-12-15 ENCOUNTER — Other Ambulatory Visit: Payer: Self-pay

## 2017-12-15 DIAGNOSIS — J45909 Unspecified asthma, uncomplicated: Secondary | ICD-10-CM | POA: Insufficient documentation

## 2017-12-15 DIAGNOSIS — H109 Unspecified conjunctivitis: Secondary | ICD-10-CM | POA: Insufficient documentation

## 2017-12-15 NOTE — ED Triage Notes (Addendum)
Pt c/o cont'd right eye pain-swelling noted to right upper eyelid-was seen at [facility] ED 1/7 for same-NAD-steady gait

## 2017-12-16 ENCOUNTER — Emergency Department (HOSPITAL_BASED_OUTPATIENT_CLINIC_OR_DEPARTMENT_OTHER)
Admission: EM | Admit: 2017-12-16 | Discharge: 2017-12-16 | Disposition: A | Discharge status: Home / Self Care | Payer: Self-pay | Attending: Emergency Medicine | Admitting: Emergency Medicine

## 2017-12-16 DIAGNOSIS — H109 Unspecified conjunctivitis: Secondary | ICD-10-CM

## 2017-12-16 MED ORDER — SULFACETAMIDE SODIUM 10 % OP SOLN
1.0000 [drp] | Freq: Four times a day (QID) | OPHTHALMIC | Status: DC
  Administered 2017-12-16: 1 [drp] via OPHTHALMIC

## 2017-12-16 MED ORDER — SULFACETAMIDE SODIUM 10 % OP SOLN
OPHTHALMIC | Status: AC
  Administered 2017-12-16: 1 [drp] via OPHTHALMIC
  Filled 2017-12-16: qty 15

## 2017-12-16 NOTE — ED Provider Notes (Signed)
[facility] EMERGENCY DEPARTMENT Provider Note   CSN: [id number] Arrival date & time: 12/15/17  2134     History   Chief Complaint Chief Complaint  Patient presents with  . Eye Pain    HPI Audrey Lang is a 27 y.o. female.  Patient is a 27 year old female with past medical history of asthma presenting for evaluation of eye pain.  She was treated 2 days ago for suspected conjunctivitis.  She was given erythromycin ointment, however is not improving.   The history is provided by the patient.  Eye Pain  This is a new problem. The current episode started more than 2 days ago. The problem occurs constantly. The problem has not changed since onset.Nothing aggravates the symptoms. Nothing relieves the symptoms.    Past Medical History:  Diagnosis Date  . Anemia   . Asthma   . Complication of anesthesia    had 3 seizures after anesthesia, severe skin rash  . Dyspnea    anemia  . Fibroid   . GERD (gastroesophageal reflux disease)   . History of blood transfusion    01/2017  . Seizures ([place]) 2009   last seizure 2011    Patient Active Problem List   Diagnosis Date Noted  . Iron deficiency anemia 01/30/2017  . Symptomatic anemia 01/30/2017  . Weakness generalized 01/29/2017  . Microcytic anemia 01/29/2017  . Transaminasemia 01/29/2017  . Abdominal pain   . H/O myomectomy 01/28/2017  . Leiomyoma 01/13/2017  . Seizures ([place])     Past Surgical History:  Procedure Laterality Date  . DILATATION & CURRETTAGE/HYSTEROSCOPY WITH RESECTOCOPE N/A 01/28/2017   Procedure: DILATATION & CURETTAGE/HYSTEROSCOPY WITH MYOSURE;  Surgeon: Tyson Dense, MD;  Location: [facility];  Service: Gynecology;  Laterality: N/A;  resection of fibriod   . DILATION AND EVACUATION N/A 01/28/2017   Procedure: ULTRASOUND GUIDED DILATATION AND EVACUATION;  Surgeon: Tyson Dense, MD;  Location: [facility];  Service: Gynecology;  Laterality: N/A;  . HYSTEROSCOPY N/A 01/28/2017   Procedure: HYSTEROSCOPY WITH INSERTION OF FOLEY CATHETER BALLOON IN UTERUS;  Surgeon: Tyson Dense, MD;  Location: [facility];  Service: Gynecology;  Laterality: N/A;  . MYOMECTOMY     in [place], no fibroids removed  . TONSILLECTOMY  2003    OB History    Gravida Para Term Preterm AB Living   1 0 0 0 1 0   SAB TAB Ectopic Multiple Live Births   1 0 0 0         Home Medications    Prior to Admission medications   Not on File    Family History Family History  Problem Relation Age of Onset  . Ulcers Mother   . Diabetes Mother   . Asthma Sister   . Diabetes Maternal Grandmother     Social History Social History   Tobacco Use  . Smoking status: Never Smoker  . Smokeless tobacco: Never Used  Substance Use Topics  . Alcohol use: Yes    Alcohol/week: 0.5 oz    Types: 1 Standard drinks or equivalent per week    Comment: social  . Drug use: No     Allergies   Banana; Dilaudid [hydromorphone hcl]; Lamictal [lamotrigine]; Lidocaine; Diprivan [propofol]; Fentanyl; Versed [midazolam]; and Zofran [ondansetron hcl]   Review of Systems Review of Systems  Eyes: Positive for pain.  All other systems reviewed and are negative.    Physical Exam Updated Vital Signs BP (!) 128/93 (BP Location: Right Arm)  Pulse 93   Temp 97.9 F (36.6 C) (Oral)   Resp 16   Ht 5\' 1"  (1.549 m)   Wt 59 kg (130 lb)   LMP 11/28/2017   SpO2 99%   BMI 24.56 kg/m   Physical Exam  Constitutional: She is oriented to person, place, and time. She appears well-developed and well-nourished. No distress.  HENT:  Head: Normocephalic and atraumatic.  Eyes: EOM are normal. Pupils are equal, round, and reactive to light.  The right conjunctiva is injected with some purulent discharge noted.  There is some swelling to the upper eyelid, however no obvious stye.  The cornea appears clear and remainder of the eye exam is unremarkable.  Neck: Normal range of motion. Neck supple.    Pulmonary/Chest: Effort normal.  Neurological: She is alert and oriented to person, place, and time.  Skin: She is not diaphoretic.  Nursing note and vitals reviewed.    ED Treatments / Results  Labs (all labs ordered are listed, but only abnormal results are displayed) Labs Reviewed - No data to display  EKG  EKG Interpretation None       Radiology No results found.  Procedures Procedures (including critical care time)  Medications Ordered in ED Medications  sulfacetamide (BLEPH-10) 10 % ophthalmic solution 1 drop (1 drop Both Eyes Given 12/16/17 0146)     Initial Impression / Assessment and Plan / ED Course  I have reviewed the triage vital signs and the nursing notes.  Pertinent labs & imaging results that were available during my care of the patient were reviewed by me and considered in my medical decision making (see chart for details).  I will change her eyedrops from erythromycin to Bleph-10 and see how she responds.  I have also advised continuing warm soaks.  Final Clinical Impressions(s) / ED Diagnoses   Final diagnoses:  None    ED Discharge Orders    None       Veryl Speak, MD 12/16/17 [phone]

## 2017-12-16 NOTE — ED Notes (Signed)
Pt given written discharge instructions. Pt discharged during computer downtime.

## 2019-02-12 IMAGING — US US ABDOMEN LIMITED
1 series · 14 of 25 positions shown · non-contrast
Comparison: None.

CLINICAL DATA: Abdominal pain for 1 day.  Elevated liver enzymes.

EXAM:
US ABDOMEN LIMITED - RIGHT UPPER QUADRANT

[Series 1: us abdomen limited · 0.20mm/px · 14 of 26 slices shown]
[im 1/26]
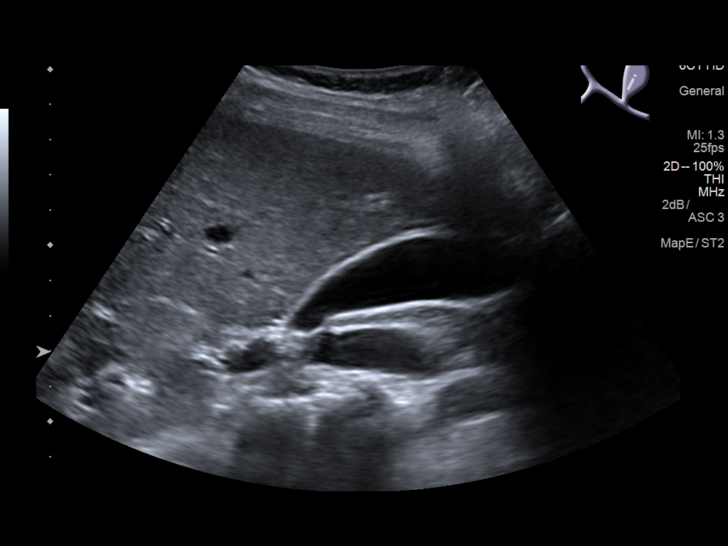
[im 3/26]
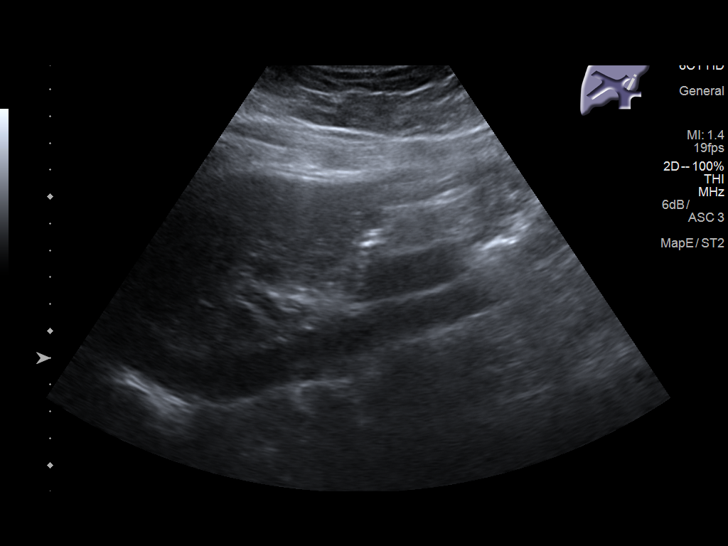
[im 5/26]
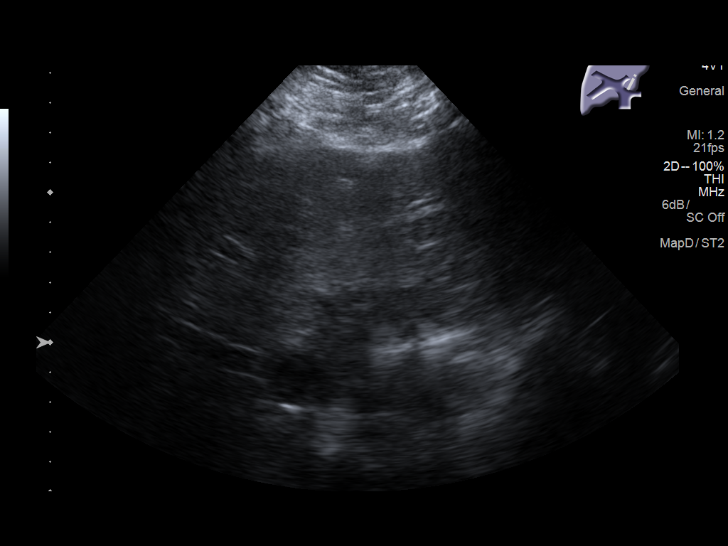
[im 7/26]
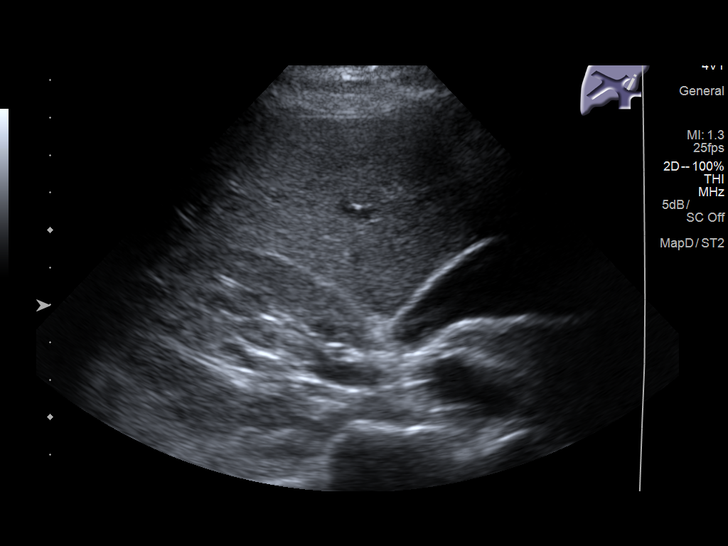
[im 9/26]
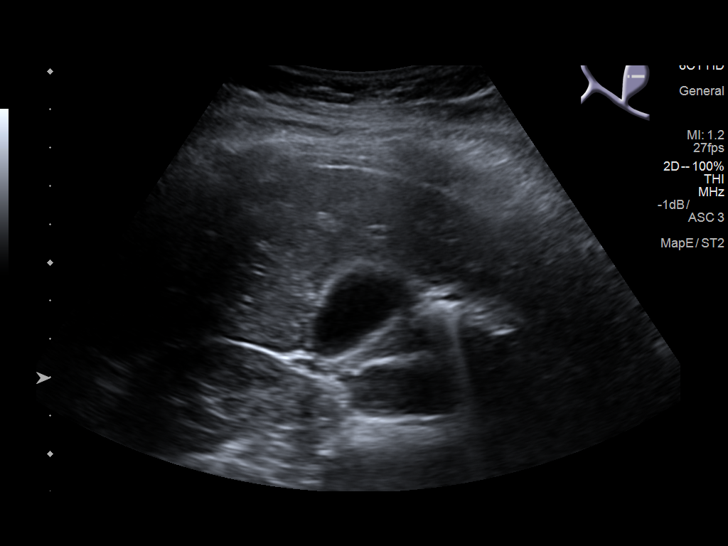
[im 10/26]
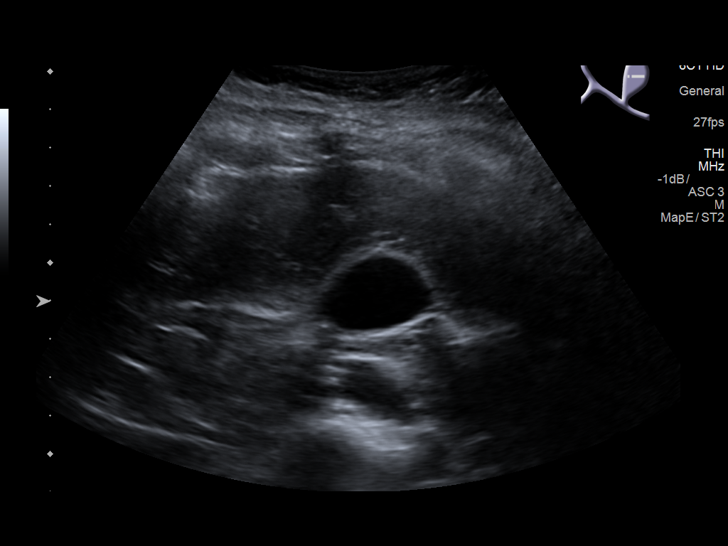
[im 12/26]
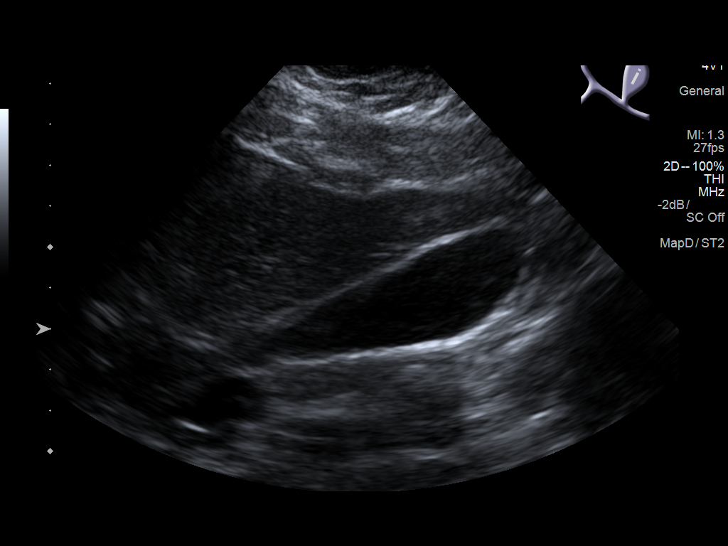
[im 14/26]
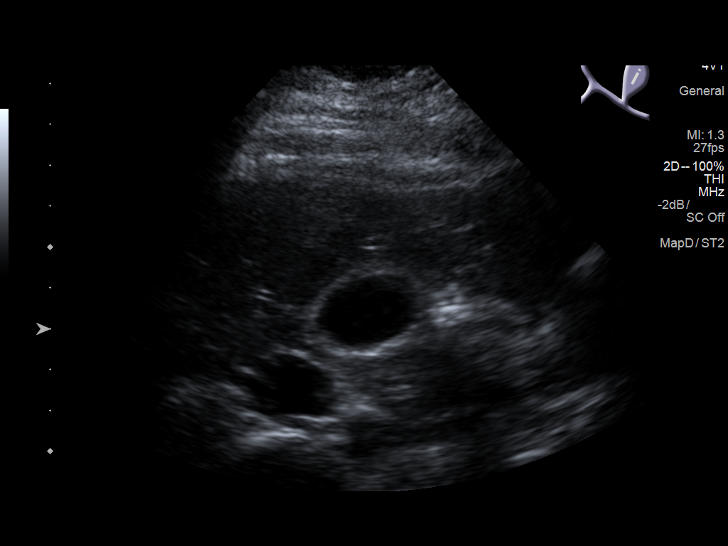
[im 16/26]
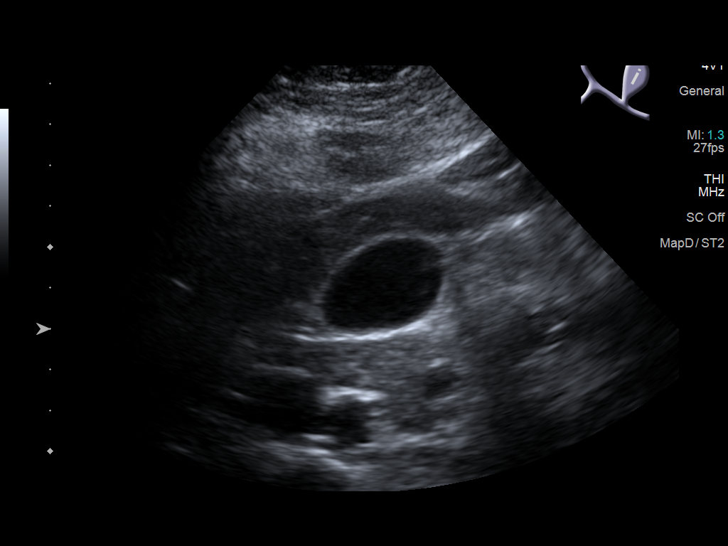
[im 17/26]
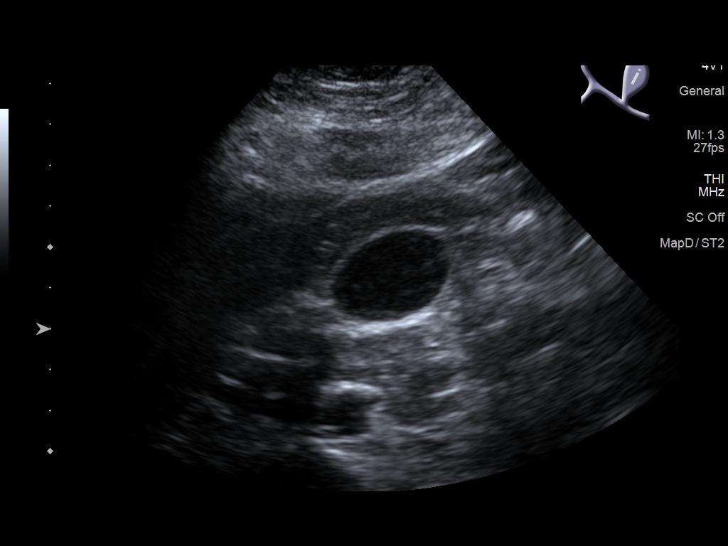
[im 19/26]
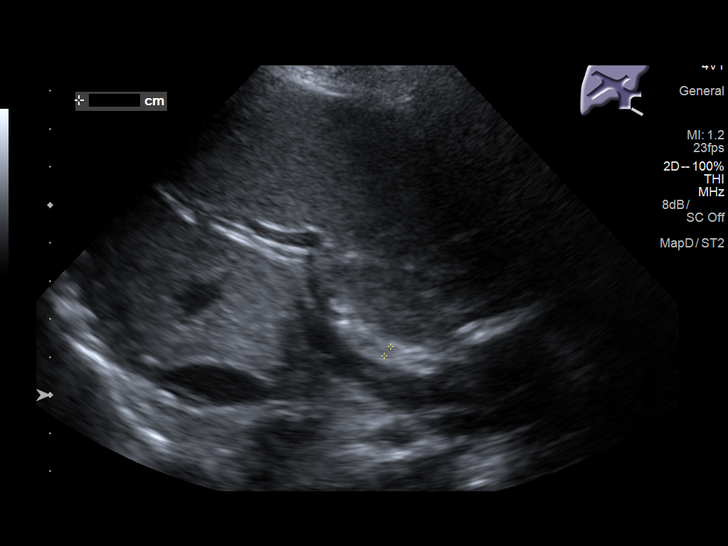
[im 21/26]
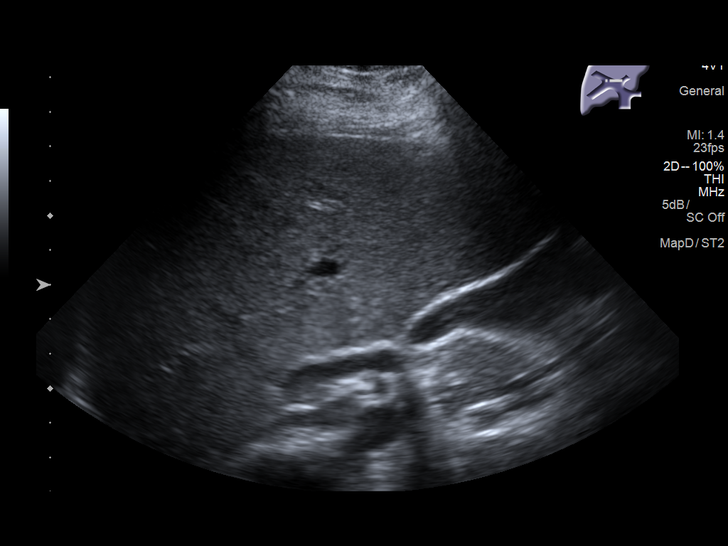
[im 23/26]
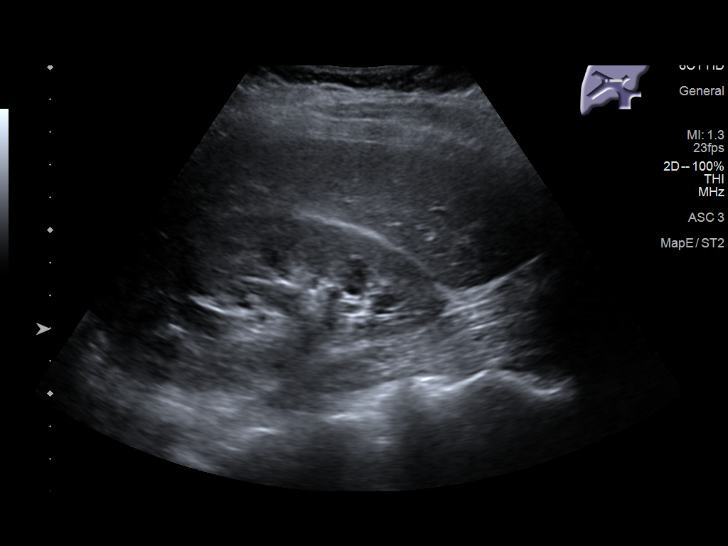
[im 26/26]
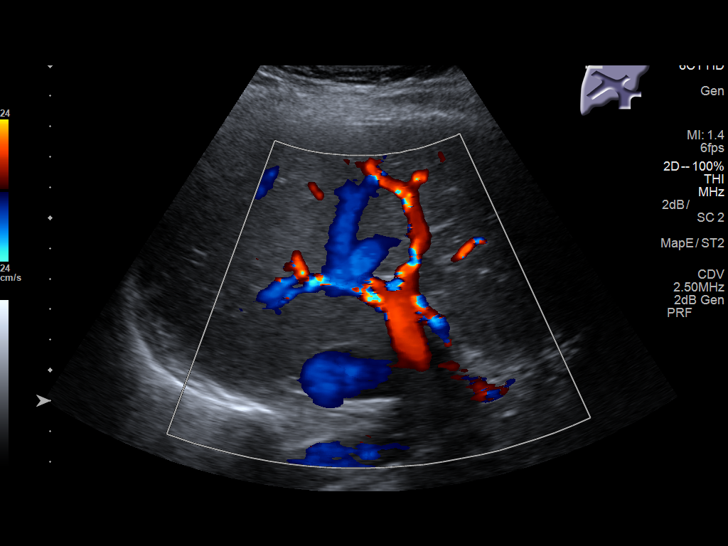

[14 of 25 positions shown; findings below may reference images not displayed]

FINDINGS: Gallbladder:

No gallstones or wall thickening visualized. No sonographic Murphy
sign noted by sonographer. Gallbladder wall thickness is 2 mm,
within normal limits.

Common bile duct:

Diameter: 3.0 mm, within normal limits.

Liver:

No focal lesion identified. Within normal limits in parenchymal
echogenicity.
IMPRESSION: Negative right upper quadrant ultrasound.
